# Patient Record
Sex: Female | Born: 1965 | Race: Black or African American | Hispanic: No | Marital: Married | Smoking: Never smoker
Health system: Southern US, Community
[De-identification: ages and names within clinical notes are randomized; demographics above are authoritative.]

## PROBLEM LIST (undated history)

## (undated) DIAGNOSIS — N939 Abnormal uterine and vaginal bleeding, unspecified: Secondary | ICD-10-CM

## (undated) DIAGNOSIS — D649 Anemia, unspecified: Secondary | ICD-10-CM

## (undated) DIAGNOSIS — N920 Excessive and frequent menstruation with regular cycle: Secondary | ICD-10-CM

## (undated) DIAGNOSIS — D219 Benign neoplasm of connective and other soft tissue, unspecified: Secondary | ICD-10-CM

## (undated) DIAGNOSIS — F419 Anxiety disorder, unspecified: Secondary | ICD-10-CM

## (undated) DIAGNOSIS — I240 Acute coronary thrombosis not resulting in myocardial infarction: Secondary | ICD-10-CM

## (undated) HISTORY — PX: TUBAL LIGATION: SHX77

## (undated) HISTORY — PX: WISDOM TOOTH EXTRACTION: SHX21

## (undated) HISTORY — DX: Excessive and frequent menstruation with regular cycle: N92.0

## (undated) HISTORY — PX: ABDOMINAL HYSTERECTOMY: SHX81

---

## 2008-02-20 ENCOUNTER — Emergency Department (HOSPITAL_COMMUNITY): Admission: EM | Admit: 2008-02-20 | Discharge: 2008-02-20 | Payer: Self-pay | Admitting: Emergency Medicine

## 2009-09-19 ENCOUNTER — Emergency Department (HOSPITAL_COMMUNITY): Admission: EM | Admit: 2009-09-19 | Discharge: 2009-09-19 | Payer: Self-pay | Admitting: Emergency Medicine

## 2010-12-02 LAB — URINALYSIS, ROUTINE W REFLEX MICROSCOPIC
Leukocytes, UA: NEGATIVE
Protein, ur: NEGATIVE mg/dL
Specific Gravity, Urine: 1.024 (ref 1.005–1.030)
Urobilinogen, UA: 1 mg/dL (ref 0.0–1.0)

## 2010-12-02 LAB — URINE MICROSCOPIC-ADD ON

## 2011-06-13 LAB — DIFFERENTIAL
Eosinophils Absolute: 0.1
Eosinophils Relative: 1
Lymphs Abs: 2
Monocytes Absolute: 0.8
Monocytes Relative: 10
Neutrophils Relative %: 61

## 2011-06-13 LAB — POCT I-STAT, CHEM 8
Calcium, Ion: 1.16
Creatinine, Ser: 0.8
Glucose, Bld: 96
Hemoglobin: 12.2
Potassium: 3.7

## 2011-06-13 LAB — CBC
HCT: 33.5 — ABNORMAL LOW
Hemoglobin: 11.4 — ABNORMAL LOW
MCV: 93.7
RBC: 3.58 — ABNORMAL LOW
WBC: 7.6

## 2015-08-15 ENCOUNTER — Encounter (HOSPITAL_COMMUNITY): Payer: Self-pay | Admitting: *Deleted

## 2015-08-15 ENCOUNTER — Inpatient Hospital Stay (HOSPITAL_COMMUNITY): Payer: Self-pay

## 2015-08-15 ENCOUNTER — Inpatient Hospital Stay (HOSPITAL_COMMUNITY)
Admission: AD | Admit: 2015-08-15 | Discharge: 2015-08-15 | Disposition: A | Discharge status: Home / Self Care | Payer: Self-pay | Source: Ambulatory Visit | Attending: Obstetrics & Gynecology | Admitting: Obstetrics & Gynecology

## 2015-08-15 DIAGNOSIS — R1903 Right lower quadrant abdominal swelling, mass and lump: Secondary | ICD-10-CM

## 2015-08-15 DIAGNOSIS — R1030 Lower abdominal pain, unspecified: Secondary | ICD-10-CM | POA: Insufficient documentation

## 2015-08-15 DIAGNOSIS — D500 Iron deficiency anemia secondary to blood loss (chronic): Secondary | ICD-10-CM | POA: Insufficient documentation

## 2015-08-15 DIAGNOSIS — R102 Pelvic and perineal pain: Secondary | ICD-10-CM | POA: Insufficient documentation

## 2015-08-15 DIAGNOSIS — N390 Urinary tract infection, site not specified: Secondary | ICD-10-CM | POA: Insufficient documentation

## 2015-08-15 DIAGNOSIS — D250 Submucous leiomyoma of uterus: Secondary | ICD-10-CM | POA: Insufficient documentation

## 2015-08-15 DIAGNOSIS — N939 Abnormal uterine and vaginal bleeding, unspecified: Secondary | ICD-10-CM | POA: Insufficient documentation

## 2015-08-15 HISTORY — DX: Abnormal uterine and vaginal bleeding, unspecified: N93.9

## 2015-08-15 HISTORY — DX: Benign neoplasm of connective and other soft tissue, unspecified: D21.9

## 2015-08-15 HISTORY — DX: Acute coronary thrombosis not resulting in myocardial infarction: I24.0

## 2015-08-15 HISTORY — DX: Anxiety disorder, unspecified: F41.9

## 2015-08-15 LAB — WET PREP, GENITAL
CLUE CELLS WET PREP: NONE SEEN
SPERM: NONE SEEN
TRICH WET PREP: NONE SEEN
YEAST WET PREP: NONE SEEN

## 2015-08-15 LAB — CBC
HCT: 26.4 % — ABNORMAL LOW (ref 36.0–46.0)
HEMOGLOBIN: 8 g/dL — AB (ref 12.0–15.0)
MCH: 19.9 pg — AB (ref 26.0–34.0)
MCHC: 30.3 g/dL (ref 30.0–36.0)
MCV: 65.5 fL — AB (ref 78.0–100.0)
Platelets: 368 10*3/uL (ref 150–400)
RBC: 4.03 MIL/uL (ref 3.87–5.11)
RDW: 18 % — ABNORMAL HIGH (ref 11.5–15.5)
WBC: 6.1 10*3/uL (ref 4.0–10.5)

## 2015-08-15 LAB — URINE MICROSCOPIC-ADD ON

## 2015-08-15 LAB — ABO/RH: ABO/RH(D): A POS

## 2015-08-15 LAB — URINALYSIS, ROUTINE W REFLEX MICROSCOPIC
Bilirubin Urine: NEGATIVE
GLUCOSE, UA: NEGATIVE mg/dL
Ketones, ur: NEGATIVE mg/dL
LEUKOCYTES UA: NEGATIVE
Nitrite: NEGATIVE
PH: 6 (ref 5.0–8.0)
Protein, ur: NEGATIVE mg/dL
Specific Gravity, Urine: 1.025 (ref 1.005–1.030)

## 2015-08-15 LAB — HIV ANTIBODY (ROUTINE TESTING W REFLEX): HIV Screen 4th Generation wRfx: NONREACTIVE

## 2015-08-15 LAB — POCT PREGNANCY, URINE: Preg Test, Ur: NEGATIVE

## 2015-08-15 MED ORDER — MEDROXYPROGESTERONE ACETATE 10 MG PO TABS: 10.0000 mg | ORAL_TABLET | Freq: Every day | ORAL | Status: DC

## 2015-08-15 MED ORDER — SULFAMETHOXAZOLE-TRIMETHOPRIM 800-160 MG PO TABS: 1.0000 | ORAL_TABLET | Freq: Two times a day (BID) | ORAL | Status: DC

## 2015-08-15 MED ORDER — IBUPROFEN 600 MG PO TABS: 600.0000 mg | ORAL_TABLET | Freq: Four times a day (QID) | ORAL | Status: DC | PRN

## 2015-08-15 NOTE — MAU Note (Addendum)
Abdominal and back pain X 5-6 days. States she has "very bad menstrual" for about a year. Has a hx of fibroids. Has not tried any pain medications, no tylenol or ibuprofen.

## 2015-08-15 NOTE — Discharge Instructions (Signed)
Uterine Fibroids Uterine fibroids are tissue masses (tumors) that can develop in the womb (uterus). They are also called leiomyomas. This type of tumor is not cancerous (benign) and does not spread to other parts of the body outside of the pelvic area, which is between the hip bones. Occasionally, fibroids may develop in the fallopian tubes, in the cervix, or on the support structures (ligaments) that surround the uterus. You can have one or many fibroids. Fibroids can vary in size, weight, and where they grow in the uterus. Some can become quite large. Most fibroids do not require medical treatment. CAUSES A fibroid can develop when a single uterine cell keeps growing (replicating). Most cells in the human body have a control mechanism that keeps them from replicating without control. SIGNS AND SYMPTOMS Symptoms may include:   Heavy bleeding during your period.  Bleeding or spotting between periods.  Pelvic pain and pressure.  Bladder problems, such as needing to urinate more often (urinary frequency) or urgently.  Inability to reproduce offspring (infertility).  Miscarriages. DIAGNOSIS Uterine fibroids are diagnosed through a physical exam. Your health care provider may feel the lumpy tumors during a pelvic exam. Ultrasonography and an MRI may be done to determine the size, location, and number of fibroids. TREATMENT Treatment may include:  Watchful waiting. This involves getting the fibroid checked by your health care provider to see if it grows or shrinks. Follow your health care provider's recommendations for how often to have this checked.  Hormone medicines. These can be taken by mouth or given through an intrauterine device (IUD).  Surgery.  Removing the fibroids (myomectomy) or the uterus (hysterectomy).  Removing blood supply to the fibroids (uterine artery embolization). If fibroids interfere with your fertility and you want to become pregnant, your health care provider  may recommend having the fibroids removed.  HOME CARE INSTRUCTIONS  Keep all follow-up visits as directed by your health care provider. This is important.  Take medicines only as directed by your health care provider.  If you were prescribed a hormone treatment, take the hormone medicines exactly as directed.  Do not take aspirin, because it can cause bleeding.  Ask your health care provider about taking iron pills and increasing the amount of dark green, leafy vegetables in your diet. These actions can help to boost your blood iron levels, which may be affected by heavy menstrual bleeding.  Pay close attention to your period and tell your health care provider about any changes, such as:  Increased blood flow that requires you to use more pads or tampons than usual per month.  A change in the number of days that your period lasts per month.  A change in symptoms that are associated with your period, such as abdominal cramping or back pain. SEEK MEDICAL CARE IF:  You have pelvic pain, back pain, or abdominal cramps that cannot be controlled with medicines.  You have an increase in bleeding between and during periods.  You soak tampons or pads in a half hour or less.  You feel lightheaded, extra tired, or weak. SEEK IMMEDIATE MEDICAL CARE IF:  You faint.  You have a sudden increase in pelvic pain.   This information is not intended to replace advice given to you by your health care provider. Make sure you discuss any questions you have with your health care provider.   Document Released: 08/30/2000 Document Revised: 09/23/2014 Document Reviewed: 03/01/2014 Elsevier Interactive Patient Education 2016 Elsevier Inc.   Urinary Tract Infection Urinary tract  infections (UTIs) can develop anywhere along your urinary tract. Your urinary tract is your body's drainage system for removing wastes and extra water. Your urinary tract includes two kidneys, two ureters, a bladder, and a  urethra. Your kidneys are a pair of bean-shaped organs. Each kidney is about the size of your fist. They are located below your ribs, one on each side of your spine. CAUSES Infections are caused by microbes, which are microscopic organisms, including fungi, viruses, and bacteria. These organisms are so small that they can only be seen through a microscope. Bacteria are the microbes that most commonly cause UTIs. SYMPTOMS  Symptoms of UTIs may vary by age and gender of the patient and by the location of the infection. Symptoms in young women typically include a frequent and intense urge to urinate and a painful, burning feeling in the bladder or urethra during urination. Older women and men are more likely to be tired, shaky, and weak and have muscle aches and abdominal pain. A fever may mean the infection is in your kidneys. Other symptoms of a kidney infection include pain in your back or sides below the ribs, nausea, and vomiting. DIAGNOSIS To diagnose a UTI, your caregiver will ask you about your symptoms. Your caregiver will also ask you to provide a urine sample. The urine sample will be tested for bacteria and Dun blood cells. Pavlich blood cells are made by your body to help fight infection. TREATMENT  Typically, UTIs can be treated with medication. Because most UTIs are caused by a bacterial infection, they usually can be treated with the use of antibiotics. The choice of antibiotic and length of treatment depend on your symptoms and the type of bacteria causing your infection. HOME CARE INSTRUCTIONS  If you were prescribed antibiotics, take them exactly as your caregiver instructs you. Finish the medication even if you feel better after you have only taken some of the medication.  Drink enough water and fluids to keep your urine clear or pale yellow.  Avoid caffeine, tea, and carbonated beverages. They tend to irritate your bladder.  Empty your bladder often. Avoid holding urine for long  periods of time.  Empty your bladder before and after sexual intercourse.  After a bowel movement, women should cleanse from front to back. Use each tissue only once. SEEK MEDICAL CARE IF:   You have back pain.  You develop a fever.  Your symptoms do not begin to resolve within 3 days. SEEK IMMEDIATE MEDICAL CARE IF:   You have severe back pain or lower abdominal pain.  You develop chills.  You have nausea or vomiting.  You have continued burning or discomfort with urination. MAKE SURE YOU:   Understand these instructions.  Will watch your condition.  Will get help right away if you are not doing well or get worse.   This information is not intended to replace advice given to you by your health care provider. Make sure you discuss any questions you have with your health care provider.   Document Released: 06/12/2005 Document Revised: 05/24/2015 Document Reviewed: 10/11/2011 Elsevier Interactive Patient Education 2016 Elsevier Inc.   Abnormal Uterine Bleeding Abnormal uterine bleeding can affect women at various stages in life, including teenagers, women in their reproductive years, pregnant women, and women who have reached menopause. Several kinds of uterine bleeding are considered abnormal, including:  Bleeding or spotting between periods.   Bleeding after sexual intercourse.   Bleeding that is heavier or more than normal.   Periods  that last longer than usual.  Bleeding after menopause.  Many cases of abnormal uterine bleeding are minor and simple to treat, while others are more serious. Any type of abnormal bleeding should be evaluated by your health care provider. Treatment will depend on the cause of the bleeding. HOME CARE INSTRUCTIONS Monitor your condition for any changes. The following actions may help to alleviate any discomfort you are experiencing:  Avoid the use of tampons and douches as directed by your health care provider.  Change your pads  frequently. You should get regular pelvic exams and Pap tests. Keep all follow-up appointments for diagnostic tests as directed by your health care provider.  SEEK MEDICAL CARE IF:   Your bleeding lasts more than 1 week.   You feel dizzy at times.  SEEK IMMEDIATE MEDICAL CARE IF:   You pass out.   You are changing pads every 15 to 30 minutes.   You have abdominal pain.  You have a fever.   You become sweaty or weak.   You are passing large blood clots from the vagina.   You start to feel nauseous and vomit. MAKE SURE YOU:   Understand these instructions.  Will watch your condition.  Will get help right away if you are not doing well or get worse.   This information is not intended to replace advice given to you by your health care provider. Make sure you discuss any questions you have with your health care provider.   Document Released: 09/02/2005 Document Revised: 09/07/2013 Document Reviewed: 04/01/2013 Elsevier Interactive Patient Education [employer].

## 2015-08-15 NOTE — MAU Provider Note (Signed)
Chief Complaint: Abdominal Pain and Back Pain   First Provider Initiated Contact with Patient 08/15/15 0919     SUBJECTIVE HPI: Kimberly Snow is a 49 y.o. [id number] female who presents [facility] reporting: 1. Low abdominal pain in mid low back pain since 08/10/2015. 2. His been feeling weak and occasionally dizzy x ~1 week.  3. Having 2 menstrual periods this month, passing clots and having prolonged and heavy periods x 1 year. Has not been evaluated for this. 4. Low abd mass that she first noticed in the past month. Told at [facility] that is might be fibroids. No imaging. Pt concerned that she may have uterine cancer.    Location: suprapubic Quality: cramping Severity: 9/10 on pain scale Duration: 5 days Timing: constant Modifying factors: Better w/ leaning forward Associated signs and symptoms: Neg for fever, chills, vaginal bleeding, vaginal discharge, dyspareunia, syncope, flank pain. Pos for urgency, frequency, weakness, low back pain, menstrual cycles changes, abd mass.   Past Medical History  Diagnosis Date  . Fibroids   . Headache     migraines  . Anxiety   . Abnormal vaginal bleeding   . Blockage of coronary artery of heart (HCC)     was supposed to have a stent placed, but left hospital. Normal F/U w/ cardiology since then   OB History  Gravida Para Term Preterm AB SAB TAB Ectopic Multiple Living  2 1 1  1  1   1     # Outcome Date GA Lbr Len/2nd Weight Sex Delivery Anes PTL Lv  2 TAB           1 Term              Past Surgical History  Procedure Laterality Date  . Wisdom tooth extraction     Social History   Social History  . Marital Status: Legally Separated    Spouse Name: N/A  . Number of Children: N/A  . Years of Education: N/A   Occupational History  . Not on file.   Social History Main Topics  . Smoking status: Never Smoker   . Smokeless tobacco: Never Used  . Alcohol Use: No  . Drug Use: No  . Sexual Activity: Not on file    Other Topics Concern  . Not on file   Social History Narrative   No current facility-administered medications on file prior to encounter.   No current outpatient prescriptions on file prior to encounter.   No Known Allergies  I have reviewed the past Medical Hx, Surgical Hx, Social Hx, Allergies and Medications.   Review of Systems  Constitutional: Positive for fatigue. Negative for fever, chills and appetite change.  Gastrointestinal: Positive for abdominal pain. Negative for nausea, vomiting, diarrhea, constipation, blood in stool and abdominal distention.  Genitourinary: Positive for urgency, frequency and menstrual problem. Negative for dysuria, hematuria, flank pain, vaginal bleeding, vaginal discharge, difficulty urinating, vaginal pain and pelvic pain.  Musculoskeletal: Positive for back pain. Negative for myalgias.  Neurological: Positive for dizziness and weakness. Negative for syncope and headaches.  Hematological: Does not bruise/bleed easily.    OBJECTIVE Patient Vitals for the past 24 hrs:  BP Temp Temp src Pulse Resp Height Weight  08/15/15 0838 147/90 mmHg 98 F (36.7 C) Oral 95 18 - -  08/15/15 0828 - - - - - 5' 9.5" (1.765 m) 219 lb (99.338 kg)   Constitutional: Well-developed, well-nourished female in no acute distress.  Cardiovascular: normal rate Respiratory: normal rate and  effort.  GI: Abd soft, mild SP tenderness, Firm suprapubic mass palpated up to 3?SP. Pos BS x 4 MS: Low back NT. Normal ROM Neurologic: Alert and oriented x 4.  GU: Neg CVAT.  SPECULUM EXAM: NEFG, physiologic discharge, no blood noted, cervix clean  BIMANUAL: cervix closed; uterus 14-week size, no adnexal tenderness or masses. No CMT. Bladder tender.   LAB RESULTS Results for orders placed or performed during the hospital encounter of 08/15/15 (from the past 24 hour(s))  Urinalysis, Routine w reflex microscopic (not at [facility])     Status: Abnormal   Collection Time: 08/15/15  8:30  AM  Result Value Ref Range   Color, Urine YELLOW YELLOW   APPearance CLOUDY (A) CLEAR   Specific Gravity, Urine 1.025 1.005 - 1.030   pH 6.0 5.0 - 8.0   Glucose, UA NEGATIVE NEGATIVE mg/dL   Hgb urine dipstick MODERATE (A) NEGATIVE   Bilirubin Urine NEGATIVE NEGATIVE   Ketones, ur NEGATIVE NEGATIVE mg/dL   Protein, ur NEGATIVE NEGATIVE mg/dL   Nitrite NEGATIVE NEGATIVE   Leukocytes, UA NEGATIVE NEGATIVE  Urine microscopic-add on     Status: Abnormal   Collection Time: 08/15/15  8:30 AM  Result Value Ref Range   Squamous Epithelial / LPF 0-5 (A) NONE SEEN   WBC, UA 0-5 0 - 5 WBC/hpf   RBC / HPF 0-5 0 - 5 RBC/hpf   Bacteria, UA RARE (A) NONE SEEN  Pregnancy, urine POC     Status: None   Collection Time: 08/15/15  8:33 AM  Result Value Ref Range   Preg Test, Ur NEGATIVE NEGATIVE  CBC     Status: Abnormal   Collection Time: 08/15/15  8:47 AM  Result Value Ref Range   WBC 6.1 4.0 - 10.5 K/uL   RBC 4.03 3.87 - 5.11 MIL/uL   Hemoglobin 8.0 (L) 12.0 - 15.0 g/dL   HCT 26.4 (L) 36.0 - 46.0 %   MCV 65.5 (L) 78.0 - 100.0 fL   MCH 19.9 (L) 26.0 - 34.0 pg   MCHC 30.3 30.0 - 36.0 g/dL   RDW 18.0 (H) 11.5 - 15.5 %   Platelets 368 150 - 400 K/uL  ABO/Rh     Status: None   Collection Time: 08/15/15  8:47 AM  Result Value Ref Range   ABO/RH(D) A POS   Wet prep, genital     Status: Abnormal   Collection Time: 08/15/15  9:44 AM  Result Value Ref Range   Yeast Wet Prep HPF POC NONE SEEN NONE SEEN   Trich, Wet Prep NONE SEEN NONE SEEN   Clue Cells Wet Prep HPF POC NONE SEEN NONE SEEN   WBC, Wet Prep HPF POC FEW (A) NONE SEEN   Sperm NONE SEEN     IMAGING US Transvaginal Non-ob  08/15/2015  CLINICAL DATA:  Lower abdominal and pelvic pain. Right lower quadrant mass. Abnormal uterine bleeding. Fibroids. LMP 07/30/2015. EXAM: TRANSABDOMINAL AND TRANSVAGINAL ULTRASOUND OF PELVIS TECHNIQUE: Both transabdominal and transvaginal ultrasound examinations of the pelvis were performed.  Transabdominal technique was performed for global imaging of the pelvis including uterus, ovaries, adnexal regions, and pelvic cul-de-sac. It was necessary to proceed with endovaginal exam following the transabdominal exam to visualize the endometrial stripe and ovaries. COMPARISON:  None FINDINGS: Uterus Measurements: 16.1 x 9.0 x 10.2 cm. Multiple fibroids are seen involving the uterus diffusely. At least 5 distinct fibroids are seen ranging in size from 1.7 cm to 8.0 cm. The largest 8 cm fibroid is  subserosal in location in the right uterine fundus. Several fibroids have submucosal components indenting the endometrium. Endometrium Thickness: 8 mm. Several submucosal fibroids seen indenting the endometrial stripe. Right ovary Measurements: 3.5 x 2.0 x 3.1 cm. Normal appearance/no adnexal mass. Left ovary Measurements: 3.6 x 1.8 x 2.9 cm. Normal appearance/no adnexal mass. Other findings No free fluid. IMPRESSION: Enlarged uterus with multiple fibroids, largest measuring 8 cm. Normal appearance of both ovaries.  No adnexal mass identified. Electronically Signed   By: Earle Gell M.D.   On: 08/15/2015 11:43   US Pelvis Complete  08/15/2015  CLINICAL DATA:  Lower abdominal and pelvic pain. Right lower quadrant mass. Abnormal uterine bleeding. Fibroids. LMP 07/30/2015. EXAM: TRANSABDOMINAL AND TRANSVAGINAL ULTRASOUND OF PELVIS TECHNIQUE: Both transabdominal and transvaginal ultrasound examinations of the pelvis were performed. Transabdominal technique was performed for global imaging of the pelvis including uterus, ovaries, adnexal regions, and pelvic cul-de-sac. It was necessary to proceed with endovaginal exam following the transabdominal exam to visualize the endometrial stripe and ovaries. COMPARISON:  None FINDINGS: Uterus Measurements: 16.1 x 9.0 x 10.2 cm. Multiple fibroids are seen involving the uterus diffusely. At least 5 distinct fibroids are seen ranging in size from 1.7 cm to 8.0 cm. The largest 8 cm  fibroid is subserosal in location in the right uterine fundus. Several fibroids have submucosal components indenting the endometrium. Endometrium Thickness: 8 mm. Several submucosal fibroids seen indenting the endometrial stripe. Right ovary Measurements: 3.5 x 2.0 x 3.1 cm. Normal appearance/no adnexal mass. Left ovary Measurements: 3.6 x 1.8 x 2.9 cm. Normal appearance/no adnexal mass. Other findings No free fluid. IMPRESSION: Enlarged uterus with multiple fibroids, largest measuring 8 cm. Normal appearance of both ovaries.  No adnexal mass identified. Electronically Signed   By: Earle Gell M.D.   On: 08/15/2015 11:43    MAU COURSE CBC, UA, UPT, [place]  Discussed Hx, Exam, labs w/ Dr. Harolyn Rutherford. Agrees w/ POC. Will D/C w/ Provera to use is case of heavy, prolonged bleeding  MDM -49 year-old female w/ acute low abd pain likely due to UTI. No evidence of Pyelo, PID. 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 No active bleeding. Mildly symptomatic, but hemodynamically stable.    ASSESSMENT 1. Submucous uterine fibroid   2. Lower abdominal pain   3. Abdominal mass, right lower quadrant   4. Abnormal uterine bleeding (AUB)   5. UTI (lower urinary tract infection)   6. Anemia due to chronic blood loss    PLAN Discharge home in stable condition. Bleeding Precautions Rx Provera, Bactrim, Ibuprofen.  OTC Iron. Increase dietary iron.      Follow-up Information    Follow up with [facility].   Specialty:  Obstetrics and Gynecology   Why:  Will call you to schedule follow-up appointment for endometrial biopsy   Contact information:   [place] [phone]      Follow up with [facility] [place].   Why:  For gynecologic emergencies   Contact information:   [address] [id number] [place] [phone]       Medication List    TAKE these medications        Biotin 5000 MCG Tabs  Take 1 tablet by mouth daily.     ibuprofen 600 MG tablet  Commonly known as:  ADVIL,MOTRIN  Take 1 tablet (600 mg total) by mouth every 6 (six) hours as needed for cramping.     medroxyPROGESTERone 10 MG tablet  Commonly known as:  PROVERA  Take 1 tablet (10 mg total) by mouth daily. Use for ten days if you have a prolonged or heavy period.     multivitamin with minerals Tabs tablet  Take 1 tablet by mouth daily.     sulfamethoxazole-trimethoprim 800-160 MG tablet  Commonly known as:  BACTRIM DS,SEPTRA DS  Take 1 tablet by mouth 2 (two) times daily.       [place], CNM 08/15/2015  12:19 PM

## 2015-08-16 LAB — GC/CHLAMYDIA PROBE AMP (~~LOC~~) NOT AT ARMC
Chlamydia: NEGATIVE
NEISSERIA GONORRHEA: NEGATIVE

## 2015-09-08 ENCOUNTER — Ambulatory Visit (INDEPENDENT_AMBULATORY_CARE_PROVIDER_SITE_OTHER): Payer: Self-pay | Admitting: Obstetrics & Gynecology

## 2015-09-08 ENCOUNTER — Encounter: Payer: Self-pay | Admitting: Obstetrics & Gynecology

## 2015-09-08 VITALS — BP 149/66 | HR 81 | Temp 98.5°F | Ht 66.0 in | Wt 221.4 lb

## 2015-09-08 DIAGNOSIS — D500 Iron deficiency anemia secondary to blood loss (chronic): Secondary | ICD-10-CM

## 2015-09-08 DIAGNOSIS — D649 Anemia, unspecified: Secondary | ICD-10-CM

## 2015-09-08 DIAGNOSIS — D259 Leiomyoma of uterus, unspecified: Secondary | ICD-10-CM | POA: Insufficient documentation

## 2015-09-08 DIAGNOSIS — Z3202 Encounter for pregnancy test, result negative: Secondary | ICD-10-CM

## 2015-09-08 DIAGNOSIS — Z1231 Encounter for screening mammogram for malignant neoplasm of breast: Secondary | ICD-10-CM

## 2015-09-08 HISTORY — DX: Leiomyoma of uterus, unspecified: D25.9

## 2015-09-08 HISTORY — DX: Anemia, unspecified: D64.9

## 2015-09-08 LAB — POCT PREGNANCY, URINE: PREG TEST UR: NEGATIVE

## 2015-09-08 MED ORDER — MEDROXYPROGESTERONE ACETATE 10 MG PO TABS: 20.0000 mg | ORAL_TABLET | Freq: Every day | ORAL | Status: DC

## 2015-09-08 NOTE — Patient Instructions (Signed)
Uterine Fibroids Uterine fibroids are tissue masses (tumors) that can develop in the womb (uterus). They are also called leiomyomas. This type of tumor is not cancerous (benign) and does not spread to other parts of the body outside of the pelvic area, which is between the hip bones. Occasionally, fibroids may develop in the fallopian tubes, in the cervix, or on the support structures (ligaments) that surround the uterus. You can have one or many fibroids. Fibroids can vary in size, weight, and where they grow in the uterus. Some can become quite large. Most fibroids do not require medical treatment. CAUSES A fibroid can develop when a single uterine cell keeps growing (replicating). Most cells in the human body have a control mechanism that keeps them from replicating without control. SIGNS AND SYMPTOMS Symptoms may include:   Heavy bleeding during your period.  Bleeding or spotting between periods.  Pelvic pain and pressure.  Bladder problems, such as needing to urinate more often (urinary frequency) or urgently.  Inability to reproduce offspring (infertility).  Miscarriages. DIAGNOSIS Uterine fibroids are diagnosed through a physical exam. Your health care provider may feel the lumpy tumors during a pelvic exam. Ultrasonography and an MRI may be done to determine the size, location, and number of fibroids. TREATMENT Treatment may include:  Watchful waiting. This involves getting the fibroid checked by your health care provider to see if it grows or shrinks. Follow your health care provider's recommendations for how often to have this checked.  Hormone medicines. These can be taken by mouth or given through an intrauterine device (IUD).  Surgery.  Removing the fibroids (myomectomy) or the uterus (hysterectomy).  Removing blood supply to the fibroids (uterine artery embolization). If fibroids interfere with your fertility and you want to become pregnant, your health care provider  may recommend having the fibroids removed.  HOME CARE INSTRUCTIONS  Keep all follow-up visits as directed by your health care provider. This is important.  Take medicines only as directed by your health care provider.  If you were prescribed a hormone treatment, take the hormone medicines exactly as directed.  Do not take aspirin, because it can cause bleeding.  Ask your health care provider about taking iron pills and increasing the amount of dark green, leafy vegetables in your diet. These actions can help to boost your blood iron levels, which may be affected by heavy menstrual bleeding.  Pay close attention to your period and tell your health care provider about any changes, such as:  Increased blood flow that requires you to use more pads or tampons than usual per month.  A change in the number of days that your period lasts per month.  A change in symptoms that are associated with your period, such as abdominal cramping or back pain. SEEK MEDICAL CARE IF:  You have pelvic pain, back pain, or abdominal cramps that cannot be controlled with medicines.  You have an increase in bleeding between and during periods.  You soak tampons or pads in a half hour or less.  You feel lightheaded, extra tired, or weak. SEEK IMMEDIATE MEDICAL CARE IF:  You faint.  You have a sudden increase in pelvic pain.   This information is not intended to replace advice given to you by your health care provider. Make sure you discuss any questions you have with your health care provider.   Document Released: 08/30/2000 Document Revised: 09/23/2014 Document Reviewed: 03/01/2014 Elsevier Interactive Patient Education 2016 Elsevier Inc.  

## 2015-09-08 NOTE — Progress Notes (Signed)
[facility] @ 1:00pm.  Pt notified.

## 2015-09-08 NOTE — Progress Notes (Signed)
Patient ID: Kimberly Snow, female   DOB: 1966-09-03, 49 y.o.   MRN: [id number]  Chief Complaint  Patient presents with  . Procedure    Endo Bx  f/u from [facility], abnl bleeding  HPI Kimberly Snow is a 49 y.o. female.  [id number] Patient's last menstrual period was 07/30/2015.   HPI  Past Medical History  Diagnosis Date  . Fibroids   . Headache     migraines  . Anxiety   . Abnormal vaginal bleeding   . Blockage of coronary artery of heart (HCC)     was supposed to have a stent placed, but left hospital    Past Surgical History  Procedure Laterality Date  . Wisdom tooth extraction      No family history on file.  Social History Social History  Substance Use Topics  . Smoking status: Never Smoker   . Smokeless tobacco: Never Used  . Alcohol Use: No    No Known Allergies  Current Outpatient Prescriptions  Medication Sig Dispense Refill  . Biotin 5000 MCG TABS Take 1 tablet by mouth daily.    Marland Kitchen ibuprofen (ADVIL,MOTRIN) 600 MG tablet Take 1 tablet (600 mg total) by mouth every 6 (six) hours as needed for cramping. 30 tablet 1  . Multiple Vitamin (MULTIVITAMIN WITH MINERALS) TABS tablet Take 1 tablet by mouth daily.    Marland Kitchen sulfamethoxazole-trimethoprim (BACTRIM DS,SEPTRA DS) 800-160 MG tablet Take 1 tablet by mouth 2 (two) times daily. 6 tablet 0  . medroxyPROGESTERone (PROVERA) 10 MG tablet Take 2 tablets (20 mg total) by mouth daily. Use for ten days if you have a prolonged or heavy period. 60 tablet 2   No current facility-administered medications for this visit.    Review of Systems Review of Systems  Blood pressure 149/66, pulse 81, temperature 98.5 F (36.9 C), temperature source Oral, height 5\' 6"  (1.676 m), weight 221 lb 6.4 oz (100.426 kg), last menstrual period 07/30/2015.  Physical Exam Physical Exam  Data Reviewed  CLINICAL DATA: Lower abdominal and pelvic pain. Right lower quadrant mass. Abnormal uterine bleeding. Fibroids. LMP  07/30/2015.  EXAM: TRANSABDOMINAL AND TRANSVAGINAL ULTRASOUND OF PELVIS  TECHNIQUE: Both transabdominal and transvaginal ultrasound examinations of the pelvis were performed. Transabdominal technique was performed for global imaging of the pelvis including uterus, ovaries, adnexal regions, and pelvic cul-de-sac. It was necessary to proceed with endovaginal exam following the transabdominal exam to visualize the endometrial stripe and ovaries.  COMPARISON: None  FINDINGS: Uterus  Measurements: 16.1 x 9.0 x 10.2 cm. Multiple fibroids are seen involving the uterus diffusely. At least 5 distinct fibroids are seen ranging in size from 1.7 cm to 8.0 cm. The largest 8 cm fibroid is subserosal in location in the right uterine fundus. Several fibroids have submucosal components indenting the endometrium.  Endometrium  Thickness: 8 mm. Several submucosal fibroids seen indenting the endometrial stripe.  Right ovary  Measurements: 3.5 x 2.0 x 3.1 cm. Normal appearance/no adnexal mass.  Left ovary  Measurements: 3.6 x 1.8 x 2.9 cm. Normal appearance/no adnexal mass.  Other findings  No free fluid.  IMPRESSION: Enlarged uterus with multiple fibroids, largest measuring 8 cm.  Normal appearance of both ovaries. No adnexal mass identified.   Electronically Signed  By: Earle Gell M.D.  On: 08/15/2015 11:43  CBC    Component Value Date/Time   WBC 6.1 08/15/2015 0847   RBC 4.03 08/15/2015 0847   HGB 8.0* 08/15/2015 0847   HCT 26.4* 08/15/2015 0847   PLT 368 08/15/2015 0847  MCV 65.5* 08/15/2015 0847   MCH 19.9* 08/15/2015 0847   MCHC 30.3 08/15/2015 0847   RDW 18.0* 08/15/2015 0847   LYMPHSABS 2.0 02/20/2008 1758   MONOABS 0.8 02/20/2008 1758   EOSABS 0.1 02/20/2008 1758   BASOSABS 0.0 02/20/2008 1758     Assessment    Fibroid uterus and DUB Anemia     Plan    Current Outpatient Prescriptions on File Prior to Visit  Medication Sig  Dispense Refill  . Biotin 5000 MCG TABS Take 1 tablet by mouth daily.    Marland Kitchen ibuprofen (ADVIL,MOTRIN) 600 MG tablet Take 1 tablet (600 mg total) by mouth every 6 (six) hours as needed for cramping. 30 tablet 1  . Multiple Vitamin (MULTIVITAMIN WITH MINERALS) TABS tablet Take 1 tablet by mouth daily.    Marland Kitchen sulfamethoxazole-trimethoprim (BACTRIM DS,SEPTRA DS) 800-160 MG tablet Take 1 tablet by mouth 2 (two) times daily. 6 tablet 0   No current facility-administered medications on file prior to visit.   Provera 20 mg po daily RTC for endometrial biopsy Needs pap result from HD        ARNOLD,JAMES 09/08/2015, 9:47 AM

## 2015-09-25 ENCOUNTER — Ambulatory Visit: Payer: Self-pay

## 2015-10-09 ENCOUNTER — Other Ambulatory Visit: Payer: Self-pay | Admitting: Obstetrics & Gynecology

## 2015-11-09 ENCOUNTER — Other Ambulatory Visit: Payer: Self-pay | Admitting: Obstetrics & Gynecology

## 2016-03-13 ENCOUNTER — Other Ambulatory Visit: Payer: Self-pay | Admitting: Obstetrics and Gynecology

## 2016-04-03 ENCOUNTER — Other Ambulatory Visit: Payer: Self-pay | Admitting: Obstetrics and Gynecology

## 2016-04-04 NOTE — Patient Instructions (Signed)
Your procedure is scheduled on:  Thursday, April 25, 2016  Enter through the Main Entrance of [facility] at:  6:00 AM   Pick up the phone at the desk and dial [phone].  Call this number if you have problems the morning of surgery: [phone].  Remember: Do NOT eat food or drink after:  Midnight Wednesday, April 24, 2016  Take these medicines the morning of surgery with a SIP OF WATER:  None  Do NOT wear jewelry (body piercing), metal hair clips/bobby pins, make-up, or nail polish. Do NOT wear lotions, powders, or perfumes.  You may wear deodorant. Do NOT shave for 48 hours prior to surgery. Do NOT bring valuables to the hospital. Contacts, dentures, or bridgework may not be worn into surgery.  Leave suitcase in car.  After surgery it may be brought to your room.  For patients admitted to the hospital, checkout time is 11:00 AM the day of discharge.

## 2016-04-05 ENCOUNTER — Inpatient Hospital Stay (HOSPITAL_COMMUNITY)
Admission: RE | Admit: 2016-04-05 | Discharge: 2016-04-05 | Disposition: A | Discharge status: Home / Self Care | Payer: Self-pay | Source: Ambulatory Visit

## 2016-04-19 ENCOUNTER — Other Ambulatory Visit (HOSPITAL_COMMUNITY): Payer: BLUE CROSS/BLUE SHIELD

## 2016-04-25 ENCOUNTER — Encounter (HOSPITAL_COMMUNITY): Admission: RE | Payer: Self-pay | Source: Ambulatory Visit

## 2016-04-25 ENCOUNTER — Inpatient Hospital Stay (HOSPITAL_COMMUNITY)
Admission: RE | Admit: 2016-04-25 | Payer: BLUE CROSS/BLUE SHIELD | Source: Ambulatory Visit | Admitting: Obstetrics and Gynecology

## 2016-04-25 SURGERY — HYSTERECTOMY, TOTAL, ABDOMINAL, WITH SALPINGECTOMY
Anesthesia: General | Laterality: Bilateral

## 2016-05-02 ENCOUNTER — Other Ambulatory Visit: Payer: Self-pay | Admitting: Obstetrics and Gynecology

## 2016-05-03 ENCOUNTER — Other Ambulatory Visit: Payer: Self-pay | Admitting: Obstetrics and Gynecology

## 2016-05-03 MED ORDER — DEXTROSE 5 % IV SOLN: 2.0000 g | INTRAVENOUS | Status: DC

## 2016-05-05 NOTE — H&P (Signed)
Kimberly Snow is a 50 y.o. female P: 1-0-2-1 who presents for hysterectomy because of symptomatic uterine fibroids and menorrhagia.  This patient,  with a known history of uterine fibroids,  reports a significant increase in her menstrual flow over the past year.  Her  flow, with clots,  would last  for  15-19 days each month and in spite of wearing pads with tampons every 2 hours,  she would still soil her clothes. In August 2017 the patient's  hemoglobin and hematocrit returned 9.5/31.4  respectively.  A pelvic ultrasound in November 2016  showed: uterus-16.1 x 9.0 x 10.2 cm with diffuse fibroid involvement; there werer   #5 distinct fibroids ranging from 1.7-8.0 cm. The largest, a right sub-serosal measured 8 cm and several with sub-mucosal component that indented the endometrium. The right ovary measured 3.5 x 2.0 x 3.1 cm and left ovary, 3.6 x 1.8 x 2.9 cm.  The patient denies fatigue, shortness of breath, bowel changes, dysuria, incontinence,  chest pain or lightheadedness. She does admit to dyspareunia,  urinary frequency  and nocturia (every 30 minutes) that has resulted in severe insomnia.  An endometrial biopsy in June 2017 revealed benign secretory endometrium with no hyperplasia or malignancy.   A Mirena IUD was placed in August 2017 in a effort to curtail the patient's bleeding as she prepared for surgical management of her symptoms.  In the interim, however,  she experienced a significant bleeding episode along with severe cramping that required IM Ketorolac 30 mg.  To curtail  this  bleeding , she was given oral progestin, however, the patient developed a  severe anxiety reaction and so it was discontinued. A review of both medical and surgical management options were given to the patient for her menstrual disorder and fibroids.  However, given the magnitude of her menstrual flow and the  challenges involved with managing her menstrual cramps,  she  has decided to proceed with definitive therapy in  the form of hysterectomy.   Past Medical History  OB History: G: 3  P: 1-0-2-1;  SVB 1992  GYN History: menarche: 50 YO    LMP: see HPI   Contracepton: Tubal Sterilization;  Has a remote history of syphilis.  Denies history of abnormal PAP smear.  Last PAP smear: 2016-normal  Medical History: Coronary Artery Disease, Migraines, Anxiety and  Anemia  Surgical History: 1992  Tubal Sterilization Denies problems with anesthesia.  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 Medroxyprogesterone Anxiety    ROS:  Denies headache, vision changes, nasal congestion, dysphagia, tinnitus, dizziness, hoarseness, cough,  chest pain, shortness of breath, nausea, vomiting, diarrhea,constipation,  urinary frequency, urgency  dysuria, hematuria, vaginitis symptoms,  swelling of joints,easy bruising,  myalgias, arthralgias, skin rashes, unexplained weight loss and except as is mentioned in the history of present illness, patient's review of systems is otherwise negative.   Physical Exam  Bp: 128/72    Temperature: 98.4 degrees F orally  Weight: 215 lbs.  Height: 5'9" BMI: 31.7  Neck: supple without masses or thyromegaly Lungs: clear to auscultation Heart: regular rate and rhythm Abdomen: firm and -tender mass from pelvis to  3 fingers below umbilicus Pelvic:EGBUS- wnl; vagina-normal rugae; uterus-18 weeks size,  irregular and tender, cervix without lesions or motion tenderness; adnexae-no tenderness or masses Extremities:  no clubbing, cyanosis or edema   Assesment: Symptomatic Uterine Fibroids             Menorrhagia   Disposition:  A discussion was held with patient regarding the indication for her  procedure(s) along with the risks, which include but are not limited to: reaction to anesthesia, damage to adjacent organs, infection and excessive bleeding. The patient verbalized understanding of these risks and has consented to proceed with a Total Abdominal Hysterectomy at [facility] on May 13, 2016.   CSN# [id number]   [place] J. Florene Glen, PA-C  for Dr. Seymour Bars. Sara Keys

## 2016-05-06 ENCOUNTER — Encounter (HOSPITAL_COMMUNITY)
Admission: RE | Admit: 2016-05-06 | Discharge: 2016-05-06 | Disposition: A | Discharge status: Home / Self Care | Payer: BLUE CROSS/BLUE SHIELD | Source: Ambulatory Visit | Attending: Obstetrics and Gynecology | Admitting: Obstetrics and Gynecology

## 2016-05-06 ENCOUNTER — Encounter (HOSPITAL_COMMUNITY): Payer: Self-pay

## 2016-05-06 DIAGNOSIS — N920 Excessive and frequent menstruation with regular cycle: Secondary | ICD-10-CM | POA: Insufficient documentation

## 2016-05-06 DIAGNOSIS — G43909 Migraine, unspecified, not intractable, without status migrainosus: Secondary | ICD-10-CM | POA: Insufficient documentation

## 2016-05-06 DIAGNOSIS — Z975 Presence of (intrauterine) contraceptive device: Secondary | ICD-10-CM | POA: Insufficient documentation

## 2016-05-06 DIAGNOSIS — D649 Anemia, unspecified: Secondary | ICD-10-CM | POA: Diagnosis not present

## 2016-05-06 DIAGNOSIS — I2510 Atherosclerotic heart disease of native coronary artery without angina pectoris: Secondary | ICD-10-CM | POA: Insufficient documentation

## 2016-05-06 DIAGNOSIS — Z888 Allergy status to other drugs, medicaments and biological substances status: Secondary | ICD-10-CM | POA: Diagnosis not present

## 2016-05-06 DIAGNOSIS — F419 Anxiety disorder, unspecified: Secondary | ICD-10-CM | POA: Diagnosis not present

## 2016-05-06 DIAGNOSIS — Z01812 Encounter for preprocedural laboratory examination: Secondary | ICD-10-CM | POA: Diagnosis not present

## 2016-05-06 DIAGNOSIS — D259 Leiomyoma of uterus, unspecified: Secondary | ICD-10-CM | POA: Diagnosis not present

## 2016-05-06 DIAGNOSIS — Z79899 Other long term (current) drug therapy: Secondary | ICD-10-CM | POA: Diagnosis not present

## 2016-05-06 HISTORY — DX: Anemia, unspecified: D64.9

## 2016-05-06 LAB — CBC
HEMATOCRIT: 31.8 % — AB (ref 36.0–46.0)
HEMOGLOBIN: 10.3 g/dL — AB (ref 12.0–15.0)
MCH: 22.2 pg — ABNORMAL LOW (ref 26.0–34.0)
MCHC: 32.4 g/dL (ref 30.0–36.0)
MCV: 68.4 fL — ABNORMAL LOW (ref 78.0–100.0)
Platelets: 282 10*3/uL (ref 150–400)
RBC: 4.65 MIL/uL (ref 3.87–5.11)
RDW: 23.9 % — AB (ref 11.5–15.5)
WBC: 7 10*3/uL (ref 4.0–10.5)

## 2016-05-06 NOTE — Patient Instructions (Addendum)
Your procedure is scheduled on:  Monday, May 13, 2016  Enter through the Main Entrance of [facility] at:  6:00 AM  Pick up the phone at the desk and dial [phone].  Call this number if you have problems the morning of surgery: [phone].  Remember: Do NOT eat food or drink after:  Midnight Sunday  Take these medicines the morning of surgery with a SIP OF WATER:  None  Do NOT wear jewelry (body piercing), metal hair clips/bobby pins, make-up, or nail polish. Do NOT wear lotions, powders, or perfumes.  You may wear deodorant. Do NOT shave for 48 hours prior to surgery. Do NOT bring valuables to the hospital. Contacts, dentures, or bridgework may not be worn into surgery.  Leave suitcase in car.  After surgery it may be brought to your room.  For patients admitted to the hospital, checkout time is 11:00 AM the day of discharge.

## 2016-05-12 ENCOUNTER — Encounter (HOSPITAL_COMMUNITY): Payer: Self-pay | Admitting: Obstetrics and Gynecology

## 2016-05-12 ENCOUNTER — Encounter: Payer: Self-pay | Admitting: Obstetrics and Gynecology

## 2016-05-12 ENCOUNTER — Telehealth: Payer: Self-pay | Admitting: Obstetrics and Gynecology

## 2016-05-12 DIAGNOSIS — N920 Excessive and frequent menstruation with regular cycle: Secondary | ICD-10-CM

## 2016-05-12 DIAGNOSIS — N921 Excessive and frequent menstruation with irregular cycle: Secondary | ICD-10-CM

## 2016-05-12 HISTORY — DX: Excessive and frequent menstruation with regular cycle: N92.0

## 2016-05-12 MED ORDER — DEXTROSE 5 % IV SOLN
2.0000 g | INTRAVENOUS | Status: AC
  Administered 2016-05-13: 2 g via INTRAVENOUS
  Filled 2016-05-12: qty 2

## 2016-05-12 NOTE — Anesthesia Preprocedure Evaluation (Addendum)
Anesthesia Evaluation  Patient identified by MRN, date of birth, ID band Patient awake    Reviewed: Allergy & Precautions, NPO status , Patient's Chart, lab work & pertinent test results  History of Anesthesia Complications Negative for: history of anesthetic complications  Airway Mallampati: II  TM Distance: >3 FB Neck ROM: Full    Dental no notable dental hx. (+) Dental Advisory Given   Pulmonary neg pulmonary ROS,    Pulmonary exam normal        Cardiovascular + CAD  Normal cardiovascular exam     Neuro/Psych  Headaches, PSYCHIATRIC DISORDERS Anxiety Depression    GI/Hepatic negative GI ROS, Neg liver ROS,   Endo/Other    Renal/GU negative Renal ROS     Musculoskeletal   Abdominal   Peds  Hematology   Anesthesia Other Findings   Reproductive/Obstetrics                          Anesthesia Physical Anesthesia Plan  ASA: III  Anesthesia Plan: General   Post-op Pain Management:    Induction: Intravenous  Airway Management Planned: Oral ETT  Additional Equipment:   Intra-op Plan:   Post-operative Plan: Extubation in OR  Informed Consent: I have reviewed the patients History and Physical, chart, labs and discussed the procedure including the risks, benefits and alternatives for the proposed anesthesia with the patient or authorized representative who has indicated his/her understanding and acceptance.   Dental advisory given  Plan Discussed with: Anesthesiologist, Surgeon and CRNA  Anesthesia Plan Comments: (I had an long discussion with Mrs. McKinzie about her cardiac history.  She has not seen her cardiologist since 2010, but has lost weight and denies any cardiac symptoms despite an active life since her initial diagnosis.  I explained to her that I have no way of knowing how severe her CAD is and that we would recommend Cardiology evaluation prior to surgery. She understands  that her risk is unknown.  She also understands that this facility does not normally care for acute cardiac problems and if there was an issue her definitive care would be delayed.  She voiced understanding and clearly stated that she wishes to proceed with her surgery today in this location.)       Anesthesia Quick Evaluation

## 2016-05-13 ENCOUNTER — Inpatient Hospital Stay (HOSPITAL_COMMUNITY): Payer: BLUE CROSS/BLUE SHIELD | Admitting: Anesthesiology

## 2016-05-13 ENCOUNTER — Encounter (HOSPITAL_COMMUNITY)
Admission: RE | Disposition: A | Discharge status: Home / Self Care | Payer: Self-pay | Source: Ambulatory Visit | Attending: Obstetrics and Gynecology

## 2016-05-13 ENCOUNTER — Encounter (HOSPITAL_COMMUNITY): Payer: Self-pay

## 2016-05-13 ENCOUNTER — Inpatient Hospital Stay (HOSPITAL_COMMUNITY)
Admission: RE | Admit: 2016-05-13 | Discharge: 2016-05-14 | DRG: 743 | Disposition: A | Discharge status: Home / Self Care | Payer: BLUE CROSS/BLUE SHIELD | Source: Ambulatory Visit | Attending: Obstetrics and Gynecology | Admitting: Obstetrics and Gynecology

## 2016-05-13 DIAGNOSIS — N921 Excessive and frequent menstruation with irregular cycle: Secondary | ICD-10-CM

## 2016-05-13 DIAGNOSIS — D252 Subserosal leiomyoma of uterus: Secondary | ICD-10-CM | POA: Diagnosis present

## 2016-05-13 DIAGNOSIS — Z30432 Encounter for removal of intrauterine contraceptive device: Secondary | ICD-10-CM

## 2016-05-13 DIAGNOSIS — I2510 Atherosclerotic heart disease of native coronary artery without angina pectoris: Secondary | ICD-10-CM | POA: Diagnosis present

## 2016-05-13 DIAGNOSIS — F419 Anxiety disorder, unspecified: Secondary | ICD-10-CM | POA: Diagnosis present

## 2016-05-13 DIAGNOSIS — D250 Submucous leiomyoma of uterus: Secondary | ICD-10-CM | POA: Diagnosis present

## 2016-05-13 DIAGNOSIS — N941 Unspecified dyspareunia: Secondary | ICD-10-CM | POA: Diagnosis present

## 2016-05-13 DIAGNOSIS — D259 Leiomyoma of uterus, unspecified: Secondary | ICD-10-CM

## 2016-05-13 DIAGNOSIS — N920 Excessive and frequent menstruation with regular cycle: Secondary | ICD-10-CM | POA: Diagnosis present

## 2016-05-13 DIAGNOSIS — D649 Anemia, unspecified: Secondary | ICD-10-CM | POA: Diagnosis present

## 2016-05-13 DIAGNOSIS — D500 Iron deficiency anemia secondary to blood loss (chronic): Secondary | ICD-10-CM

## 2016-05-13 DIAGNOSIS — D251 Intramural leiomyoma of uterus: Secondary | ICD-10-CM | POA: Diagnosis present

## 2016-05-13 HISTORY — PX: HYSTERECTOMY ABDOMINAL WITH SALPINGECTOMY: SHX6725

## 2016-05-13 LAB — COMPREHENSIVE METABOLIC PANEL
ALBUMIN: 3.7 g/dL (ref 3.5–5.0)
ALT: 11 U/L — ABNORMAL LOW (ref 14–54)
AST: 16 U/L (ref 15–41)
Alkaline Phosphatase: 42 U/L (ref 38–126)
Anion gap: 6 (ref 5–15)
BUN: 10 mg/dL (ref 6–20)
CHLORIDE: 99 mmol/L — AB (ref 101–111)
CO2: 28 mmol/L (ref 22–32)
Calcium: 8.4 mg/dL — ABNORMAL LOW (ref 8.9–10.3)
Creatinine, Ser: 0.68 mg/dL (ref 0.44–1.00)
GFR calc Af Amer: >60 mL/min (ref >60)
Glucose, Bld: 93 mg/dL (ref 65–99)
POTASSIUM: 2.8 mmol/L — AB (ref 3.5–5.1)
Sodium: 133 mmol/L — ABNORMAL LOW (ref 135–145)
Total Bilirubin: 0.5 mg/dL (ref 0.3–1.2)
Total Protein: 7.5 g/dL (ref 6.5–8.1)

## 2016-05-13 LAB — PREGNANCY, URINE: PREG TEST UR: NEGATIVE

## 2016-05-13 LAB — PREPARE RBC (CROSSMATCH)

## 2016-05-13 SURGERY — HYSTERECTOMY, TOTAL, ABDOMINAL, WITH SALPINGECTOMY
Anesthesia: General | Site: Uterus | Laterality: Bilateral

## 2016-05-13 MED ORDER — HYDROMORPHONE HCL 1 MG/ML IJ SOLN
0.2500 mg | INTRAMUSCULAR | Status: DC | PRN
  Administered 2016-05-13 (×3): 0.5 mg via INTRAVENOUS

## 2016-05-13 MED ORDER — GLYCOPYRROLATE 0.2 MG/ML IJ SOLN
INTRAMUSCULAR | Status: DC | PRN
  Administered 2016-05-13: 0.6 mg via INTRAVENOUS

## 2016-05-13 MED ORDER — ONDANSETRON HCL 4 MG PO TABS: 4.0000 mg | ORAL_TABLET | Freq: Three times a day (TID) | ORAL | Status: DC | PRN

## 2016-05-13 MED ORDER — MENTHOL 3 MG MT LOZG: 1.0000 | LOZENGE | OROMUCOSAL | Status: DC | PRN

## 2016-05-13 MED ORDER — ONDANSETRON HCL 4 MG/2ML IJ SOLN
INTRAMUSCULAR | Status: DC | PRN
Start: 2016-05-13 — End: 2016-05-13
  Administered 2016-05-13: 4 mg via INTRAVENOUS

## 2016-05-13 MED ORDER — HYDROMORPHONE HCL 1 MG/ML IJ SOLN
INTRAMUSCULAR | Status: AC
  Filled 2016-05-13: qty 1

## 2016-05-13 MED ORDER — METOPROLOL TARTRATE 5 MG/5ML IV SOLN
5.0000 mg | INTRAVENOUS | Status: AC | PRN
  Administered 2016-05-13 (×2): 2.5 mg via INTRAVENOUS
  Filled 2016-05-13: qty 5

## 2016-05-13 MED ORDER — LACTATED RINGERS IV SOLN
INTRAVENOUS | Status: DC
  Administered 2016-05-13 (×4): via INTRAVENOUS

## 2016-05-13 MED ORDER — BUPIVACAINE HCL (PF) 0.25 % IJ SOLN
INTRAMUSCULAR | Status: AC
  Filled 2016-05-13: qty 30

## 2016-05-13 MED ORDER — LIDOCAINE HCL 1 % IJ SOLN
INTRAMUSCULAR | Status: AC
  Filled 2016-05-13: qty 20

## 2016-05-13 MED ORDER — NALOXONE HCL 0.4 MG/ML IJ SOLN: 0.4000 mg | INTRAMUSCULAR | Status: DC | PRN

## 2016-05-13 MED ORDER — FENTANYL CITRATE (PF) 100 MCG/2ML IJ SOLN
INTRAMUSCULAR | Status: DC | PRN
  Administered 2016-05-13: 50 ug via INTRAVENOUS
  Administered 2016-05-13: 100 ug via INTRAVENOUS
  Administered 2016-05-13: 50 ug via INTRAVENOUS
  Administered 2016-05-13: 150 ug via INTRAVENOUS
  Administered 2016-05-13: 50 ug via INTRAVENOUS
  Administered 2016-05-13: 100 ug via INTRAVENOUS

## 2016-05-13 MED ORDER — DEXAMETHASONE SODIUM PHOSPHATE 4 MG/ML IJ SOLN
INTRAMUSCULAR | Status: AC
  Filled 2016-05-13: qty 1

## 2016-05-13 MED ORDER — ESMOLOL HCL 100 MG/10ML IV SOLN
INTRAVENOUS | Status: DC | PRN
  Administered 2016-05-13: 50 mg via INTRAVENOUS

## 2016-05-13 MED ORDER — MIDAZOLAM HCL 2 MG/2ML IJ SOLN
INTRAMUSCULAR | Status: AC
  Filled 2016-05-13: qty 2

## 2016-05-13 MED ORDER — SCOPOLAMINE 1 MG/3DAYS TD PT72
MEDICATED_PATCH | TRANSDERMAL | Status: AC
  Administered 2016-05-13: 1.5 mg via TRANSDERMAL
  Filled 2016-05-13: qty 1

## 2016-05-13 MED ORDER — PROPOFOL 10 MG/ML IV BOLUS
INTRAVENOUS | Status: DC | PRN
  Administered 2016-05-13: 50 mg via INTRAVENOUS
  Administered 2016-05-13: 150 mg via INTRAVENOUS

## 2016-05-13 MED ORDER — IBUPROFEN 600 MG PO TABS: 600.0000 mg | ORAL_TABLET | Freq: Four times a day (QID) | ORAL | Status: DC | PRN

## 2016-05-13 MED ORDER — FERROUS SULFATE 325 (65 FE) MG PO TABS
325.0000 mg | ORAL_TABLET | Freq: Two times a day (BID) | ORAL | Status: DC
  Administered 2016-05-14: 325 mg via ORAL
  Filled 2016-05-13: qty 1

## 2016-05-13 MED ORDER — LIDOCAINE HCL (CARDIAC) 20 MG/ML IV SOLN
INTRAVENOUS | Status: AC
  Filled 2016-05-13: qty 5

## 2016-05-13 MED ORDER — MIDAZOLAM HCL 2 MG/2ML IJ SOLN
INTRAMUSCULAR | Status: DC | PRN
  Administered 2016-05-13: 2 mg via INTRAVENOUS

## 2016-05-13 MED ORDER — BUPIVACAINE HCL (PF) 0.25 % IJ SOLN
INTRAMUSCULAR | Status: DC | PRN
  Administered 2016-05-13: 20 mL

## 2016-05-13 MED ORDER — SCOPOLAMINE 1 MG/3DAYS TD PT72
1.0000 | MEDICATED_PATCH | Freq: Once | TRANSDERMAL | Status: DC
  Administered 2016-05-13: 1.5 mg via TRANSDERMAL

## 2016-05-13 MED ORDER — LIDOCAINE HCL (CARDIAC) 20 MG/ML IV SOLN
INTRAVENOUS | Status: DC | PRN
  Administered 2016-05-13: 100 mg via INTRAVENOUS

## 2016-05-13 MED ORDER — HYDROMORPHONE HCL 1 MG/ML IJ SOLN
INTRAMUSCULAR | Status: DC | PRN
  Administered 2016-05-13 (×2): 1 mg via INTRAVENOUS

## 2016-05-13 MED ORDER — ONDANSETRON HCL 4 MG/2ML IJ SOLN
4.0000 mg | Freq: Four times a day (QID) | INTRAMUSCULAR | Status: DC | PRN
  Administered 2016-05-13: 4 mg via INTRAVENOUS
  Filled 2016-05-13: qty 2

## 2016-05-13 MED ORDER — ONDANSETRON HCL 4 MG/2ML IJ SOLN
INTRAMUSCULAR | Status: AC
  Filled 2016-05-13: qty 2

## 2016-05-13 MED ORDER — KETOROLAC TROMETHAMINE 30 MG/ML IJ SOLN: 30.0000 mg | Freq: Four times a day (QID) | INTRAMUSCULAR | Status: DC

## 2016-05-13 MED ORDER — DOCUSATE SODIUM 100 MG PO CAPS
100.0000 mg | ORAL_CAPSULE | Freq: Two times a day (BID) | ORAL | Status: DC
  Administered 2016-05-13: 100 mg via ORAL
  Filled 2016-05-13: qty 1

## 2016-05-13 MED ORDER — KETOROLAC TROMETHAMINE 30 MG/ML IJ SOLN
INTRAMUSCULAR | Status: DC | PRN
Start: 2016-05-13 — End: 2016-05-13
  Administered 2016-05-13: 30 mg via INTRAVENOUS

## 2016-05-13 MED ORDER — KETOROLAC TROMETHAMINE 30 MG/ML IJ SOLN
INTRAMUSCULAR | Status: AC
  Filled 2016-05-13: qty 1

## 2016-05-13 MED ORDER — DEXAMETHASONE SODIUM PHOSPHATE 10 MG/ML IJ SOLN
INTRAMUSCULAR | Status: DC | PRN
  Administered 2016-05-13: 10 mg via INTRAVENOUS

## 2016-05-13 MED ORDER — DIPHENHYDRAMINE HCL 50 MG/ML IJ SOLN
12.5000 mg | Freq: Four times a day (QID) | INTRAMUSCULAR | Status: DC | PRN
  Administered 2016-05-13 (×2): 12.5 mg via INTRAVENOUS
  Filled 2016-05-13 (×2): qty 1

## 2016-05-13 MED ORDER — KETOROLAC TROMETHAMINE 30 MG/ML IJ SOLN
30.0000 mg | Freq: Four times a day (QID) | INTRAMUSCULAR | Status: DC
  Administered 2016-05-13 – 2016-05-14 (×3): 30 mg via INTRAVENOUS
  Filled 2016-05-13 (×3): qty 1

## 2016-05-13 MED ORDER — ROCURONIUM BROMIDE 100 MG/10ML IV SOLN
INTRAVENOUS | Status: DC | PRN
  Administered 2016-05-13 (×2): 10 mg via INTRAVENOUS
  Administered 2016-05-13: 50 mg via INTRAVENOUS
  Administered 2016-05-13 (×2): 10 mg via INTRAVENOUS

## 2016-05-13 MED ORDER — NEOSTIGMINE METHYLSULFATE 10 MG/10ML IV SOLN
INTRAVENOUS | Status: DC | PRN
  Administered 2016-05-13: 4 mg via INTRAVENOUS

## 2016-05-13 MED ORDER — OXYCODONE-ACETAMINOPHEN 5-325 MG PO TABS
1.0000 | ORAL_TABLET | ORAL | Status: DC | PRN
  Administered 2016-05-14: 1 via ORAL
  Filled 2016-05-13: qty 1

## 2016-05-13 MED ORDER — SODIUM CHLORIDE 0.9% FLUSH: 9.0000 mL | INTRAVENOUS | Status: DC | PRN

## 2016-05-13 MED ORDER — PROPOFOL 10 MG/ML IV BOLUS
INTRAVENOUS | Status: AC
  Filled 2016-05-13: qty 20

## 2016-05-13 MED ORDER — DIPHENHYDRAMINE HCL 12.5 MG/5ML PO ELIX
12.5000 mg | ORAL_SOLUTION | Freq: Four times a day (QID) | ORAL | Status: DC | PRN
  Administered 2016-05-14: 12.5 mg via ORAL
  Filled 2016-05-13: qty 5

## 2016-05-13 MED ORDER — ROCURONIUM BROMIDE 100 MG/10ML IV SOLN
INTRAVENOUS | Status: AC
  Filled 2016-05-13: qty 1

## 2016-05-13 MED ORDER — DEXAMETHASONE SODIUM PHOSPHATE 10 MG/ML IJ SOLN
INTRAMUSCULAR | Status: AC
  Filled 2016-05-13: qty 1

## 2016-05-13 MED ORDER — FENTANYL CITRATE (PF) 250 MCG/5ML IJ SOLN
INTRAMUSCULAR | Status: AC
  Filled 2016-05-13: qty 5

## 2016-05-13 MED ORDER — LACTATED RINGERS IV SOLN
INTRAVENOUS | Status: DC
Start: 2016-05-13 — End: 2016-05-14
  Administered 2016-05-13: 21:00:00 via INTRAVENOUS

## 2016-05-13 MED ORDER — HYDROMORPHONE 1 MG/ML IV SOLN
INTRAVENOUS | Status: DC
  Administered 2016-05-13: 3.9 mg via INTRAVENOUS
  Administered 2016-05-13: 14:00:00 via INTRAVENOUS
  Administered 2016-05-13 – 2016-05-14 (×2): 2.4 mg via INTRAVENOUS
  Filled 2016-05-13: qty 25

## 2016-05-13 SURGICAL SUPPLY — 51 items
BLADE SURG 10 STRL SS (BLADE) ×1 IMPLANT
CANISTER SUCT 3000ML (MISCELLANEOUS) ×2 IMPLANT
CATH ROBINSON RED A/P 16FR (CATHETERS) IMPLANT
CLOTH BEACON ORANGE TIMEOUT ST (SAFETY) ×2 IMPLANT
CONT PATH 16OZ SNAP LID 3702 (MISCELLANEOUS) ×2 IMPLANT
DECANTER SPIKE VIAL GLASS SM (MISCELLANEOUS) ×1 IMPLANT
DRAIN JACKSON PRT FLT 7MM (DRAIN) IMPLANT
DRAPE WARM FLUID 44X44 (DRAPE) IMPLANT
DRSG OPSITE POSTOP 4X10 (GAUZE/BANDAGES/DRESSINGS) ×2 IMPLANT
DURAPREP 26ML APPLICATOR (WOUND CARE) ×2 IMPLANT
ELECT CAUTERY BLADE 6.4 (BLADE) IMPLANT
ELECT NDL TIP 2.8 STRL (NEEDLE) IMPLANT
ELECT NEEDLE TIP 2.8 STRL (NEEDLE) ×2 IMPLANT
EVACUATOR SILICONE 100CC (DRAIN) IMPLANT
EXTENDER ELECT LOOP LEEP 10CM (CUTTING LOOP) ×1 IMPLANT
GAUZE SPONGE 4X4 16PLY XRAY LF (GAUZE/BANDAGES/DRESSINGS) ×1 IMPLANT
GLOVE BIOGEL PI IND STRL 7.0 (GLOVE) ×1 IMPLANT
GLOVE BIOGEL PI INDICATOR 7.0 (GLOVE) ×3
GLOVE SURG SS PI 6.5 STRL IVOR (GLOVE) ×4 IMPLANT
GOWN STRL REUS W/TWL LRG LVL3 (GOWN DISPOSABLE) ×6 IMPLANT
LIQUID BAND (GAUZE/BANDAGES/DRESSINGS) ×1 IMPLANT
NDL SPNL 22GX3.5 QUINCKE BK (NEEDLE) ×1 IMPLANT
NEEDLE HYPO 22GX1.5 SAFETY (NEEDLE) IMPLANT
NEEDLE SPNL 22GX3.5 QUINCKE BK (NEEDLE) ×2 IMPLANT
NS IRRIG 1000ML POUR BTL (IV SOLUTION) ×4 IMPLANT
PACK ABDOMINAL GYN (CUSTOM PROCEDURE TRAY) ×2 IMPLANT
PAD OB MATERNITY 4.3X12.25 (PERSONAL CARE ITEMS) ×2 IMPLANT
PENCIL SMOKE EVAC W/HOLSTER (ELECTROSURGICAL) ×2 IMPLANT
PROTECTOR NERVE ULNAR (MISCELLANEOUS) ×4 IMPLANT
SPONGE LAP 18X18 X RAY DECT (DISPOSABLE) ×4 IMPLANT
STAPLER VISISTAT 35W (STAPLE) IMPLANT
SUT MNCRL AB 3-0 PS2 27 (SUTURE) ×1 IMPLANT
SUT PDS AB 1 CT  36 (SUTURE)
SUT PDS AB 1 CT 36 (SUTURE) IMPLANT
SUT PLAIN 2 0 XLH (SUTURE) IMPLANT
SUT SILK 0 FSL (SUTURE) IMPLANT
SUT VIC AB 0 CT1 18XCR BRD8 (SUTURE) ×3 IMPLANT
SUT VIC AB 0 CT1 27 (SUTURE) ×4
SUT VIC AB 0 CT1 27XBRD ANBCTR (SUTURE) IMPLANT
SUT VIC AB 0 CT1 27XCR 8 STRN (SUTURE) ×1 IMPLANT
SUT VIC AB 0 CT1 8-18 (SUTURE) ×6
SUT VIC AB 2-0 CT1 (SUTURE) ×2 IMPLANT
SUT VIC AB 3-0 SH 27 (SUTURE)
SUT VIC AB 3-0 SH 27X BRD (SUTURE) IMPLANT
SUT VICRYL 0 TIES 12 18 (SUTURE) ×2 IMPLANT
SYR 50ML LL SCALE MARK (SYRINGE) IMPLANT
SYR CONTROL 10ML LL (SYRINGE) ×1 IMPLANT
SYR TB 1ML 25GX5/8 (SYRINGE) IMPLANT
TOWEL OR 17X24 6PK STRL BLUE (TOWEL DISPOSABLE) ×4 IMPLANT
TRAY FOLEY CATH SILVER 14FR (SET/KITS/TRAYS/PACK) ×2 IMPLANT
WATER STERILE IRR 1000ML POUR (IV SOLUTION) ×2 IMPLANT

## 2016-05-13 NOTE — Op Note (Signed)
05/13/2016  11:40 AM  PATIENT:  Kimberly Snow  50 y.o. female MRN:  [id number]  PRE-OPERATIVE DIAGNOSIS:  Uterine Fibroids, Menorrhagia  POST-OPERATIVE DIAGNOSIS:  Uterine Fibroids, Menorrhagia  PROCEDURE:  Procedure(s): HYSTERECTOMY ABDOMINAL WITH SALPINGECTOMY with iud removal  SURGEON:  Surgeon(s): Eldred Manges, MD  ASSISTANTS: Earnstine Regal PA-C   ANESTHESIA:  General  ESTIMATED BLOOD LOSS: 123456 cc  COMPLICATIONS:none  FINDINGS:  The uterus was enlarged to 18 weeks sized and weighed more than 800 gms.  The tubes were s/p tubal sterilization with only the fimbriated ends remaining on each side.  The ovaries were normal bilaterally.    BLOOD ADMINISTERED:none  DRAINS: Urinary Catheter (Foley)   LOCAL MEDICATIONS USED:  MARCAINE    and Amount: 20 ml  SPECIMEN:  Source of Specimen:  uterus, cervix, portion of right fallopian tube with suture, portion of left fallopian tube.  DISPOSITION OF SPECIMEN:  PATHOLOGY  COUNTS:  YES  PROCEDURE: The patient was taken to the operating room after appropriate identification and placed on the operating table in the supine position. Equipment for the induction of general anesthesia was placed and after the attainment of adequate general anesthesia the abdomen, perineum, and vagina were prepped with multiple layers of Betadine.a Foley catheter was inserted into the bladder under sterile conditions and connected to straight drainage.  A time out was undertaken.   The abdomen was draped as a sterile field.  Suprapubic injection of 20 cc of quarter percent Marcaine was undertaken. A suprapubic transverse incision was made and the abdomen opened in layers. Peritoneum was entered and a self-retaining retractor placed in the abdominal cavity after manual examination of the pelvis and upper abdomen was done. A bladder blade was placed. Large Kelly clamps were placed at the cornual regions of the uterus. The left round ligament was then  identified clamped cut and suture ligated. That incision was taken anteriorly on the anterior leaf of the broad ligament. The utero-ovarian ligament was identified clamped cut, free tied and suture-ligated. A similar procedure was carried out on the opposite side with the round ligament and utero-ovarian ligament. The bladder flap was completed on the opposite side with incision of the anterior leaf of the broad ligament. The bladder was dissected off the anterior cervix with a combination of blunt and sharp dissection. The uterine arteries on the right and left side were clamped cut and suture ligated. The uterine fundus was excised and removed from the operative field with improved visualization.  Parametial tissues on the right and left side were clamped, cut and suture-ligated down to the level of the cervix. The cervical tissues were then clamped cut and suture ligated. Uterosacral ligaments were clamped cut suture-ligated and those sutures held. The vaginal angles were clamped cut suture ligated and the sutures held. The remainder of the vagina was incised and the uterus and cervix were removed from the operative field. Vaginal cuff was closed with figure-of-eight sutures of 0-Vicryl. Copious irrigation was carried out. The sutures holding the vaginal angles and uterosacral ligaments were then tied together.  The right portion of tube was clamped at the mesosalpinx and excised, then suture ligated.  A similar procedure was carried out on the opposite side, with both tubes removed from the operative field.  Hemostasis was noted to be adequate and all instruments were removed from the peritoneal cavity.  The abdominal peritoneum was closed using running suture of 2-0 Vicryl.  The rectus fascia was closed with running sutures of 0-Vicryl from each  apex to  the midline and tied in the midline. The subcutaneous tissue was made hemostatic with Bovie cautery and irrigated, then reapproximated with interrupted  sutures of 0-Vicryl. The skin incision was closed with a subcuticular suture of 3-0 Monocryl, and that covered with Dermabond.   Sterile dressing was applied. The patient was awakened from general anesthesia and taken to the recovery room in satisfactory condition having tolerated the procedure well with  sponge and instrument counts correct.  PLAN OF CARE: Admit to floor after post anesthesia care  PATIENT DISPOSITION:  PACU - hemodynamically stable.   Delay start of Pharmacological VTE agent (>24hrs) due to surgical blood loss or risk of bleeding:  Yes.  SCD's used throughout the surgery and post op period,.   Sarah Baez P 11:40 AM

## 2016-05-13 NOTE — H&P (Signed)
History and Physical Interval Note:   05/13/2016   7:18 AM   Kimberly Snow  has presented today for surgery, with the diagnosis of Uterine Fibroids, Menorrhagia  The various methods of treatment have been discussed with the patient and family. After consideration of risks, benefits and other options for treatment, the patient has consented to  Procedure(s): HYSTERECTOMY ABDOMINAL WITH SALPINGECTOMY as a surgical intervention .  I have reviewed the patients' chart and labs.  Questions were answered to the patient's satisfaction.  We discussed the patient's remote hx of CAD.  Since that time the patient has had intentional weight loss which has been maintained and she has been totally asymptomatic. Today's ECG is read as NSR, possible left atrial enlargement.  BP 130/81   Pulse 86   Temp 98.3 F (36.8 C) (Oral)   Resp 18   SpO2 100%   The patient wants to proceed with  Surgery today and acknowledges the risks reviewed with the anesthesiologist..   Eldred Manges  MD

## 2016-05-13 NOTE — Transfer of Care (Signed)
Immediate Anesthesia Transfer of Care Note  Patient: Kimberly Snow  Procedure(s) Performed: Procedure(s) with comments: HYSTERECTOMY ABDOMINAL WITH SALPINGECTOMY with iud removal (Bilateral) - 3 Hours  Patient Location: PACU  Anesthesia Type:General  Level of Consciousness: awake, alert  and oriented  Airway & Oxygen Therapy: Patient Spontanous Breathing and Patient connected to nasal cannula oxygen  Post-op Assessment: Report given to RN, Post -op Vital signs reviewed and stable and Patient moving all extremities X 4  Post vital signs: Reviewed and stable  Last Vitals:  Vitals:   05/13/16 0617  BP: 130/81  Pulse: 86  Resp: 18  Temp: 36.8 C    Last Pain:  Vitals:   05/13/16 0617  TempSrc: Oral  PainSc: 5       Patients Stated Pain Goal: 5 ([id number])  Complications: No apparent anesthesia complications

## 2016-05-13 NOTE — Progress Notes (Signed)
Day of Surgery Procedure(s) (LRB): HYSTERECTOMY ABDOMINAL WITH SALPINGECTOMY with iud removal (Bilateral)  Subjective: Patient reports nausea and a single episode of vomiting.Incisional pain is well controlled with PCA, but it may be contributing to nausea.  She is not yet tolerating PO.  She has ambulated twice with minimal dizziness which also contributed to nausea  Objective: I have reviewed patient's vital signs, intake and output and medications.  General: alert, cooperative and no distress Resp: clear to auscultation bilaterally Cardio: regular rate and rhythm, S1, S2 normal, no murmur, click, rub or gallop GI: soft, non-tender; bowel sounds normal; no masses,  no organomegaly and incision: dressing is clean, dry and intact Extremities: extremities normal, atraumatic, no cyanosis or edema Vaginal Bleeding: minimal  Assessment: s/p Procedure(s) with comments: HYSTERECTOMY ABDOMINAL WITH SALPINGECTOMY with iud removal (Bilateral) - 3 Hours: stable and not tolerating diet  Plan: Advance diet Encourage ambulation Advance to PO medication as soon as pt is able to tolerate.  LOS: 0 days    [name],VANESSA P 05/13/2016, 6:52 PM

## 2016-05-13 NOTE — Anesthesia Postprocedure Evaluation (Signed)
Anesthesia Post Note  Patient: Kimberly Snow  Procedure(s) Performed: Procedure(s) (LRB): HYSTERECTOMY ABDOMINAL WITH SALPINGECTOMY with iud removal (Bilateral)  Patient location during evaluation: PACU Anesthesia Type: General Level of consciousness: awake and alert Pain management: pain level controlled Vital Signs Assessment: post-procedure vital signs reviewed and stable Respiratory status: spontaneous breathing, nonlabored ventilation, respiratory function stable and patient connected to nasal cannula oxygen Cardiovascular status: blood pressure returned to baseline and stable Postop Assessment: no signs of nausea or vomiting Anesthetic complications: no     Last Vitals:  Vitals:   05/13/16 1300 05/13/16 1315  BP: 137/74 140/81  Pulse: 69 88  Resp: 16 (!) 26  Temp:      Last Pain:  Vitals:   05/13/16 1315  TempSrc:   PainSc: 3    Pain Goal: Patients Stated Pain Goal: 5 (05/13/16 1315)               Effie Berkshire

## 2016-05-13 NOTE — Anesthesia Procedure Notes (Signed)
Procedure Name: Intubation Performed by: Jonna Munro Pre-anesthesia Checklist: Suction available, Emergency Drugs available, Timeout performed, Patient identified and Patient being monitored Patient Re-evaluated:Patient Re-evaluated prior to inductionOxygen Delivery Method: Circle system utilized Preoxygenation: Pre-oxygenation with 100% oxygen Intubation Type: IV induction Ventilation: Mask ventilation without difficulty Laryngoscope Size: Mac and 3 Grade View: Grade I Tube type: Oral Tube size: 7.0 mm Number of attempts: 1 Placement Confirmation: ETT inserted through vocal cords under direct vision,  positive ETCO2 and breath sounds checked- equal and bilateral Secured at: 22 cm Tube secured with: Tape Dental Injury: Teeth and Oropharynx as per pre-operative assessment

## 2016-05-13 NOTE — Progress Notes (Signed)
Was called into the room by pt son. Pt stated she was having a panic attack and was very hot. Pt insisted on having SCD's removed. Pt feeling very nauseous. Pt given prn meds to help with anxiety and nausea which were effective

## 2016-05-14 ENCOUNTER — Encounter (HOSPITAL_COMMUNITY): Payer: Self-pay | Admitting: Obstetrics and Gynecology

## 2016-05-14 LAB — CBC
HCT: 23.5 % — ABNORMAL LOW (ref 36.0–46.0)
HEMOGLOBIN: 7.4 g/dL — AB (ref 12.0–15.0)
MCH: 21.8 pg — AB (ref 26.0–34.0)
MCHC: 31.5 g/dL (ref 30.0–36.0)
MCV: 69.1 fL — ABNORMAL LOW (ref 78.0–100.0)
Platelets: 251 10*3/uL (ref 150–400)
RBC: 3.4 MIL/uL — ABNORMAL LOW (ref 3.87–5.11)
RDW: 23.5 % — AB (ref 11.5–15.5)
WBC: 13.3 10*3/uL — ABNORMAL HIGH (ref 4.0–10.5)

## 2016-05-14 MED ORDER — ONDANSETRON HCL 4 MG PO TABS: 4.0000 mg | ORAL_TABLET | Freq: Three times a day (TID) | ORAL | 0 refills | Status: DC | PRN

## 2016-05-14 MED ORDER — OXYCODONE-ACETAMINOPHEN 5-325 MG PO TABS: 1.0000 | ORAL_TABLET | ORAL | 0 refills | Status: DC | PRN

## 2016-05-14 MED ORDER — IBUPROFEN 600 MG PO TABS: ORAL_TABLET | ORAL | 1 refills | Status: DC

## 2016-05-14 NOTE — Progress Notes (Signed)
Kimberly Snow is a71 y.o.  [id number]  Post Op Date # 1:  TAH/BS  Subjective: Patient is Doing well postoperatively. Patient has Pain is controlled with current analgesics. Medications being used: prescription NSAID's including Ketorolac 30 mg.. Has ambulated many times in the hall without dizziness or lightheadedness;  has voided and tolerated a regular diet. Patient reported a few  episodes of anxiety while attached to PCA and SCD hose.  Once both removed and she was allowed to walk more freely, those symptoms subsided.    Objective: Vital signs in last 24 hours: Temp:  [97.9 F (36.6 C)-98.8 F (37.1 C)] 98.8 F (37.1 C) (08/29 0604) Pulse Rate:  [53-94] 82 (08/29 0604) Resp:  [16-26] 18 (08/29 0604) BP: (120-154)/(57-81) 125/57 (08/29 0604) SpO2:  [97 %-100 %] 99 % (08/29 0604)  Intake/Output from previous day: 08/28 0701 - 08/29 0700 In: 3240 [P.O.:940; I.V.:2300] Out: 3945 [Urine:3570] Intake/Output this shift: No intake/output data recorded.  Recent Labs Lab 05/14/16 0515  WBC 13.3*  HGB 7.4*  HCT 23.5*  PLT 251   EXAM: General: alert, cooperative and no distress Resp: clear to auscultation bilaterally Cardio: regular rate and rhythm, S1, S2 normal, no murmur, click, rub or gallop GI: Bowel sounds present; Honeycomb dressing intact/clean/dry. Extremities: Homans sign is negative, no sign of DVT and no calf tenderness. Vaginal Bleeding: none   Assessment: s/p Procedure(s): HYSTERECTOMY ABDOMINAL WITH SALPINGECTOMY with iud removal: stable, progressing well, tolerating diet and anemia  Plan: Advance to PO medication Routine care.  Pt requests discharge home as she has achieved benchmarks of voiding, tolerating regular diet and po pain meds with good relief of pain, and is afebrile with stable vs.  Will DC  LOS: 1 day    POWELL,ELMIRA, PA-C 05/14/2016 7:45 AM

## 2016-05-14 NOTE — Progress Notes (Signed)
Assumed care of pt. Detail head to toe assessment done. See flow sheet for details.

## 2016-05-14 NOTE — Discharge Instructions (Signed)
Call [facility] @ [phone] if:  You have a temperature greater than or equal to 100.4 degrees Farenheit orally You have pain that is not made better by the pain medication given and taken as directed You have excessive bleeding or problems urinating  Take Colace (Docusate Sodium/Stool Softener) 100 mg 2-3 times daily while taking narcotic pain medicine to avoid constipation or until bowel movements are regular. Continue iron supplements twice a day for the next 6 months Ibuprofen 600 mg with food, every 6 hours for 5 days then as needed for pain  You may drive after 2  weeks You may walk up steps  You may shower  You may resume a regular diet  Keep incisions clean and dry;  remove honeycomb dressing on May 19, 2016 Do not lift over 15 pounds for 6 weeks Avoid anything in vagina for 6 weeks.  Keep post operative visit with Dr. Leo Grosser on June 20, 2016 at 9:30 a.m.

## 2016-05-14 NOTE — Discharge Summary (Signed)
Physician Discharge Summary  Patient ID: Younique My MRN: [id number] DOB/AGE: 09/21/1965 50 y.o.  Admit date: 05/13/2016 Discharge date: 05/14/2016   Discharge Diagnoses:  Symptomatic Uterine Fibroids, Menorrhagia and Anemia Active Problems:   Menorrhagia now s/p hysterectomy  Anemia Anxiety    Operation: Total Abdominal Hysterectomy with Bilateral Salpingectomy   Discharged Condition: Good  Hospital Course: On the date of admission,  the patient underwent the aforementioned procedures and tolerated them well.  Post operative course was unremarkable with the patient resuming bowel and bladder function by post operative day #1 and was therefore deemed ready for discharge home.  Discharge hemoglobin/hematocrit = 7.4/23.5 respectively.  Disposition: 01-Home or Self Care                        Pt will be referred to a PCP for rountine health maintenance  Discharge Medications:    Medication List    STOP taking these medications   hyoscyamine 0.375 MG 12 hr tablet Commonly known as:  LEVBID   ketorolac 10 MG tablet Commonly known as:  TORADOL     TAKE these medications   Biotin 5000 MCG Tabs Take 5,000 mg by mouth daily.   BLACK CURRANT SEED OIL PO Take 5 mLs by mouth daily.   diphenhydrAMINE 25 MG tablet Commonly known as:  BENADRYL Take 25 mg by mouth every 8 (eight) hours as needed (For allergic reaction.).   ferrous sulfate 325 (65 FE) MG tablet Take 325 mg by mouth daily with breakfast.   ibuprofen 600 MG tablet Commonly known as:  ADVIL,MOTRIN 1  po pc every 6 hours for 5 days then prn-pain   ondansetron 4 MG tablet Commonly known as:  ZOFRAN Take 1 tablet (4 mg total) by mouth every 8 (eight) hours as needed for nausea or vomiting.   oxyCODONE-acetaminophen 5-325 MG tablet Commonly known as:  PERCOCET/ROXICET Take 1-2 tablets by mouth every 4 (four) hours as needed for severe pain (moderate to severe pain (when tolerating fluids)).           Follow-up: Dr. Lorriane Shire P. [name] on June 20, 2016 at 9:30 a.m.   SignedEarnstine Regal, PA-C 05/14/2016, 8:07 AM

## 2016-05-14 NOTE — Progress Notes (Signed)
Assumed care of patient. Discharge instructions provided by RN Lavella Lemons. Pt verbalizes understanding of discharge instructions. Px slip given and pt aware to take to pharmacy for medication.

## 2016-05-14 NOTE — Progress Notes (Signed)
Pt discharged off the unit via ambulatory. Pt escorted with 2 RN out to pt pickup area.

## 2016-05-17 LAB — TYPE AND SCREEN
ABO/RH(D): A POS
ANTIBODY SCREEN: NEGATIVE
UNIT DIVISION: 0
UNIT DIVISION: 0

## 2016-05-17 NOTE — Telephone Encounter (Signed)
TC to pt in preparation for surgery on 05/13/16.  Pt had all her questions answere and wishes to proceed.

## 2016-05-22 ENCOUNTER — Encounter: Payer: Self-pay | Admitting: Nurse Practitioner

## 2016-05-22 ENCOUNTER — Ambulatory Visit (INDEPENDENT_AMBULATORY_CARE_PROVIDER_SITE_OTHER): Payer: BLUE CROSS/BLUE SHIELD | Admitting: Nurse Practitioner

## 2016-05-22 ENCOUNTER — Other Ambulatory Visit (INDEPENDENT_AMBULATORY_CARE_PROVIDER_SITE_OTHER): Payer: BLUE CROSS/BLUE SHIELD

## 2016-05-22 VITALS — BP 142/80 | HR 88 | Temp 98.1°F | Ht 69.0 in | Wt 214.0 lb

## 2016-05-22 DIAGNOSIS — R6889 Other general symptoms and signs: Secondary | ICD-10-CM | POA: Diagnosis not present

## 2016-05-22 DIAGNOSIS — Z90710 Acquired absence of both cervix and uterus: Secondary | ICD-10-CM | POA: Insufficient documentation

## 2016-05-22 DIAGNOSIS — R079 Chest pain, unspecified: Secondary | ICD-10-CM

## 2016-05-22 DIAGNOSIS — E878 Other disorders of electrolyte and fluid balance, not elsewhere classified: Secondary | ICD-10-CM | POA: Diagnosis not present

## 2016-05-22 DIAGNOSIS — Z0001 Encounter for general adult medical examination with abnormal findings: Secondary | ICD-10-CM | POA: Diagnosis not present

## 2016-05-22 DIAGNOSIS — R030 Elevated blood-pressure reading, without diagnosis of hypertension: Secondary | ICD-10-CM

## 2016-05-22 DIAGNOSIS — Z0000 Encounter for general adult medical examination without abnormal findings: Secondary | ICD-10-CM | POA: Diagnosis not present

## 2016-05-22 DIAGNOSIS — IMO0001 Reserved for inherently not codable concepts without codable children: Secondary | ICD-10-CM

## 2016-05-22 DIAGNOSIS — D500 Iron deficiency anemia secondary to blood loss (chronic): Secondary | ICD-10-CM

## 2016-05-22 DIAGNOSIS — F410 Panic disorder [episodic paroxysmal anxiety] without agoraphobia: Secondary | ICD-10-CM | POA: Insufficient documentation

## 2016-05-22 DIAGNOSIS — K5900 Constipation, unspecified: Secondary | ICD-10-CM | POA: Insufficient documentation

## 2016-05-22 LAB — CBC WITH DIFFERENTIAL/PLATELET
BASOS PCT: 0.2 % (ref 0.0–3.0)
Basophils Absolute: 0 10*3/uL (ref 0.0–0.1)
EOS PCT: 0.9 % (ref 0.0–5.0)
Eosinophils Absolute: 0.1 10*3/uL (ref 0.0–0.7)
HCT: 29.4 % — ABNORMAL LOW (ref 36.0–46.0)
Hemoglobin: 9.4 g/dL — ABNORMAL LOW (ref 12.0–15.0)
LYMPHS ABS: 1.4 10*3/uL (ref 0.7–4.0)
Lymphocytes Relative: 12.3 % (ref 12.0–46.0)
MCHC: 32.1 g/dL (ref 30.0–36.0)
MCV: 72.8 fl — AB (ref 78.0–100.0)
MONOS PCT: 8.2 % (ref 3.0–12.0)
Monocytes Absolute: 0.9 10*3/uL (ref 0.1–1.0)
NEUTROS ABS: 8.8 10*3/uL — AB (ref 1.4–7.7)
Neutrophils Relative %: 78.4 % — ABNORMAL HIGH (ref 43.0–77.0)
PLATELETS: 414 10*3/uL — AB (ref 150.0–400.0)
RBC: 4.04 Mil/uL (ref 3.87–5.11)
RDW: 25.3 % — ABNORMAL HIGH (ref 11.5–15.5)
WBC: 11.3 10*3/uL — ABNORMAL HIGH (ref 4.0–10.5)

## 2016-05-22 LAB — COMPREHENSIVE METABOLIC PANEL
ALK PHOS: 53 U/L (ref 39–117)
ALT: 16 U/L (ref 0–35)
AST: 20 U/L (ref 0–37)
Albumin: 4 g/dL (ref 3.5–5.2)
BUN: 11 mg/dL (ref 6–23)
CALCIUM: 8.9 mg/dL (ref 8.4–10.5)
CO2: 31 meq/L (ref 19–32)
Chloride: 102 mEq/L (ref 96–112)
Creatinine, Ser: 0.74 mg/dL (ref 0.40–1.20)
GFR: 106.96 mL/min (ref >60.00)
GLUCOSE: 95 mg/dL (ref 70–99)
POTASSIUM: 3.5 meq/L (ref 3.5–5.1)
Sodium: 138 mEq/L (ref 135–145)
Total Bilirubin: 0.6 mg/dL (ref 0.2–1.2)
Total Protein: 7.5 g/dL (ref 6.0–8.3)

## 2016-05-22 LAB — LIPID PANEL
CHOL/HDL RATIO: 5
CHOLESTEROL: 221 mg/dL — AB (ref 0–200)
HDL: 43.4 mg/dL (ref >39.00)
LDL Cholesterol: 145 mg/dL — ABNORMAL HIGH (ref 0–99)
NonHDL: 178.04
TRIGLYCERIDES: 166 mg/dL — AB (ref 0.0–149.0)
VLDL: 33.2 mg/dL (ref 0.0–40.0)

## 2016-05-22 LAB — TSH: TSH: 1.37 u[IU]/mL (ref 0.35–4.50)

## 2016-05-22 LAB — HEMOGLOBIN A1C: Hgb A1c MFr Bld: 5.3 % (ref 4.6–6.5)

## 2016-05-22 NOTE — Progress Notes (Signed)
Subjective:    Patient ID: Kimberly Snow, female    DOB: May 29, 1966, 50 y.o.   MRN: [id number]  Patient presents today for complete physical or establish care (new patient). Denies any acute complains  Chest Pain   This is a chronic problem. The current episode started more than 1 year ago. The onset quality is gradual. The problem occurs intermittently. The problem has been unchanged. The pain is present in the substernal region. The pain is at a severity of 3/10. The pain is mild. The quality of the pain is described as tightness. The pain does not radiate. Pertinent negatives include no back pain, cough, diaphoresis, exertional chest pressure, fever, irregular heartbeat, leg pain, lower extremity edema, malaise/fatigue, nausea, near-syncope, numbness, orthopnea, palpitations, PND, shortness of breath, sputum production, syncope, vomiting or weakness. She has tried nothing for the symptoms. Risk factors include stress.  Her family medical history is significant for CAD, diabetes, heart disease, hyperlipidemia and hypertension.  Pertinent negatives for family medical history include: no early MI, no stroke and no sudden death. Prior diagnostic workup includes echocardiogram and exercise treadmill test (work up done 2010 by cardiology, needs referral to continue f/up with cardiology as recommended in past.).    Immunizations: (TDAP, Hep C screen, Pneumovax, Influenza, zoster)  Health Maintenance  Topic Date Due  . Tetanus Vaccine  09/01/1985  . Pap Smear  09/02/1987  . Flu Shot  04/16/2016  . HIV Screening  Completed   Diet:healthy Weight: stable Wt Readings from Last 3 Encounters:  05/22/16 214 lb (97.1 kg)  05/06/16 216 lb 8 oz (98.2 kg)  09/08/15 221 lb 6.4 oz (100.4 kg)   Exercise:none at this time due to surgery  Depression/Suicide:none No flowsheet data found. No flowsheet data found. Colonoscopy (every 5-10yr, >50-790yr:needed Mammogram (yearly,  >4513yrneeded  Advanced Directive: Advanced Directives 05/13/2016  Does patient have an advance directive? No  Would patient like information on creating an advanced directive? -   Sexual History (birth control, marital status, STD):single , not sexually active at this time due to surgery.  Medications and allergies reviewed with patient and updated if appropriate.  Patient Active Problem List   Diagnosis Date Noted  . S/P abdominal hysterectomy 05/22/2016  . Chest pain 05/22/2016  . Elevated BP 05/22/2016  . Constipation 05/22/2016  . Panic attacks 05/22/2016  . Anemia 09/08/2015    Current Outpatient Prescriptions on File Prior to Visit  Medication Sig Dispense Refill  . Biotin 5000 MCG TABS Take 5,000 mg by mouth daily.     . BMarland KitchenACK CURRANT SEED OIL PO Take 5 mLs by mouth daily.    . diphenhydrAMINE (BENADRYL) 25 MG tablet Take 25 mg by mouth every 8 (eight) hours as needed (For allergic reaction.).    . fMarland Kitchenrrous sulfate 325 (65 FE) MG tablet Take 325 mg by mouth daily with breakfast.    . ibuprofen (ADVIL,MOTRIN) 600 MG tablet 1  po pc every 6 hours for 5 days then prn-pain 30 tablet 1  . ondansetron (ZOFRAN) 4 MG tablet Take 1 tablet (4 mg total) by mouth every 8 (eight) hours as needed for nausea or vomiting. 20 tablet 0  . oxyCODONE-acetaminophen (PERCOCET/ROXICET) 5-325 MG tablet Take 1-2 tablets by mouth every 4 (four) hours as needed for severe pain (moderate to severe pain (when tolerating fluids)). (Patient not taking: Reported on 05/22/2016) 30 tablet 0   No current facility-administered medications on file prior to visit.     Past Medical History:  Diagnosis Date  .  Abnormal vaginal bleeding   . Anemia   . Anxiety   . Blockage of coronary artery of heart (HCC)    was supposed to have a stent placed, but left hospital  . Depression   . Fibroids   . Headache    migraines  . Menorrhagia 05/12/2016    Past Surgical History:  Procedure Laterality Date  .  ABDOMINAL HYSTERECTOMY  05/13/2017   ovaries still present.  Marland Kitchen HYSTERECTOMY ABDOMINAL WITH SALPINGECTOMY Bilateral 05/13/2016   Procedure: HYSTERECTOMY ABDOMINAL WITH SALPINGECTOMY with iud removal;  Surgeon: Eldred Manges, MD;  Location: [facility];  Service: Gynecology;  Laterality: Bilateral;  3 Hours  . TUBAL LIGATION    . WISDOM TOOTH EXTRACTION      Social History   Social History  . Marital status: Legally Separated    Spouse name: N/A  . Number of children: N/A  . Years of education: N/A   Social History Main Topics  . Smoking status: Never Smoker  . Smokeless tobacco: Never Used  . Alcohol use No  . Drug use: No  . Sexual activity: Yes    Birth control/ protection: Surgical     Comment: ABD hysterectomy without ovaries   Other Topics Concern  . None   Social History Narrative  . None    Family History  Problem Relation Age of Onset  . Diabetes Mother   . Hypertension Mother   . Alcohol abuse Father   . Cancer Father   . Hypertension Brother   . Heart disease Paternal Uncle   . Hypertension Paternal Uncle   . Hyperlipidemia Paternal Uncle         Review of Systems  Constitutional: Negative for chills, diaphoresis, fever and malaise/fatigue.  HENT: Negative.   Respiratory: Negative for cough, sputum production and shortness of breath.   Cardiovascular: Positive for chest pain. Negative for palpitations, orthopnea, syncope, PND and near-syncope.  Gastrointestinal: Positive for constipation. Negative for blood in stool, diarrhea, melena, nausea and vomiting.  Genitourinary: Negative.   Musculoskeletal: Negative for back pain, falls, joint pain and myalgias.  Skin: Negative.   Neurological: Negative.  Negative for weakness and numbness.  Endo/Heme/Allergies: Negative.   Psychiatric/Behavioral: Negative for depression and suicidal ideas. The patient is nervous/anxious.     Objective:   Vitals:   05/22/16 1123  BP: (!) 142/80  Pulse: 88  Temp: 98.1  F (36.7 C)    Body mass index is 31.6 kg/m.   Physical Examination:  Physical Exam  Constitutional: She is oriented to person, place, and time and well-developed, well-nourished, and in no distress.  HENT:  Right Ear: External ear normal.  Left Ear: External ear normal.  Nose: Nose normal.  Mouth/Throat: Oropharynx is clear and moist. No oropharyngeal exudate.  Eyes: Conjunctivae and EOM are normal. Pupils are equal, round, and reactive to light. No scleral icterus.  Neck: Normal range of motion. Neck supple. No thyromegaly present.  Cardiovascular: Normal rate, normal heart sounds and intact distal pulses.   Pulmonary/Chest: Effort normal and breath sounds normal.  Abdominal: Soft. Bowel sounds are normal.  Musculoskeletal: Normal range of motion. She exhibits no edema.  Lymphadenopathy:    She has no cervical adenopathy.  Neurological: She is alert and oriented to person, place, and time. Gait normal.  Skin: Skin is warm and dry.  Surgical incision appears clean and dry, edges well approximated, no drainage, no erythema.  Psychiatric: Affect and judgment normal.  Vitals reviewed.  Recent Results (from the past  2160 hour(s))  CBC     Status: Abnormal   Collection Time: 05/06/16  2:45 PM  Result Value Ref Range   WBC 7.0 4.0 - 10.5 K/uL   RBC 4.65 3.87 - 5.11 MIL/uL   Hemoglobin 10.3 (L) 12.0 - 15.0 g/dL   HCT 31.8 (L) 36.0 - 46.0 %   MCV 68.4 (L) 78.0 - 100.0 fL   MCH 22.2 (L) 26.0 - 34.0 pg   MCHC 32.4 30.0 - 36.0 g/dL   RDW 23.9 (H) 11.5 - 15.5 %   Platelets 282 150 - 400 K/uL  Type and screen [place]     Status: None   Collection Time: 05/06/16  2:45 PM  Result Value Ref Range   ABO/RH(D) A POS    Antibody Screen NEG    Sample Expiration 05/20/2016    Extend sample reason NO TRANSFUSIONS OR PREGNANCY IN THE PAST 3 MONTHS    Unit Number [id number]    Blood Component Type RED CELLS,LR    Unit division 00    Status of Unit REL FROM  [facility]    Transfusion Status OK TO TRANSFUSE    Crossmatch Result Compatible    Unit Number [id number]    Blood Component Type RED CELLS,LR    Unit division 00    Status of Unit REL FROM [facility]    Transfusion Status OK TO TRANSFUSE    Crossmatch Result Compatible   Pregnancy, urine     Status: None   Collection Time: 05/13/16  6:00 AM  Result Value Ref Range   Preg Test, Ur NEGATIVE NEGATIVE    Comment:        THE SENSITIVITY OF THIS METHODOLOGY IS >20 mIU/mL.   Comprehensive metabolic panel     Status: Abnormal   Collection Time: 05/13/16  6:20 AM  Result Value Ref Range   Sodium 133 (L) 135 - 145 mmol/L   Potassium 2.8 (L) 3.5 - 5.1 mmol/L   Chloride 99 (L) 101 - 111 mmol/L   CO2 28 22 - 32 mmol/L   Glucose, Bld 93 65 - 99 mg/dL   BUN 10 6 - 20 mg/dL   Creatinine, Ser 0.68 0.44 - 1.00 mg/dL   Calcium 8.4 (L) 8.9 - 10.3 mg/dL   Total Protein 7.5 6.5 - 8.1 g/dL   Albumin 3.7 3.5 - 5.0 g/dL   AST 16 15 - 41 U/L   ALT 11 (L) 14 - 54 U/L   Alkaline Phosphatase 42 38 - 126 U/L   Total Bilirubin 0.5 0.3 - 1.2 mg/dL   GFR calc non Af Amer >60 >60 mL/min   GFR calc Af Amer >60 >60 mL/min    Comment: (NOTE) The eGFR has been calculated using the CKD EPI equation. This calculation has not been validated in all clinical situations. eGFR's persistently <60 mL/min signify possible Chronic Kidney Disease.    Anion gap 6 5 - 15  Prepare RBC (crossmatch)     Status: None   Collection Time: 05/13/16  6:30 AM  Result Value Ref Range   Order Confirmation ORDER PROCESSED BY BLOOD BANK   CBC     Status: Abnormal   Collection Time: 05/14/16  5:15 AM  Result Value Ref Range   WBC 13.3 (H) 4.0 - 10.5 K/uL   RBC 3.40 (L) 3.87 - 5.11 MIL/uL   Hemoglobin 7.4 (L) 12.0 - 15.0 g/dL   HCT 23.5 (L) 36.0 - 46.0 %   MCV 69.1 (L) 78.0 -  100.0 fL   MCH 21.8 (L) 26.0 - 34.0 pg   MCHC 31.5 30.0 - 36.0 g/dL   RDW 23.5 (H) 11.5 - 15.5 %   Platelets 251 150 - 400 K/uL  CBC w/Diff     Status:  Abnormal   Collection Time: 05/22/16 12:08 PM  Result Value Ref Range   WBC 11.3 (H) 4.0 - 10.5 K/uL   RBC 4.04 3.87 - 5.11 Mil/uL   Hemoglobin 9.4 (L) 12.0 - 15.0 g/dL   HCT 29.4 (L) 36.0 - 46.0 %   MCV 72.8 (L) 78.0 - 100.0 fl   MCHC 32.1 30.0 - 36.0 g/dL   RDW 25.3 (H) 11.5 - 15.5 %   Platelets 414.0 (H) 150.0 - 400.0 K/uL   Neutrophils Relative % 78.4 (H) 43.0 - 77.0 %   Lymphocytes Relative 12.3 12.0 - 46.0 %   Monocytes Relative 8.2 3.0 - 12.0 %   Eosinophils Relative 0.9 0.0 - 5.0 %   Basophils Relative 0.2 0.0 - 3.0 %   Neutro Abs 8.8 (H) 1.4 - 7.7 K/uL   Lymphs Abs 1.4 0.7 - 4.0 K/uL   Monocytes Absolute 0.9 0.1 - 1.0 K/uL   Eosinophils Absolute 0.1 0.0 - 0.7 K/uL   Basophils Absolute 0.0 0.0 - 0.1 K/uL  Comprehensive metabolic panel     Status: None   Collection Time: 05/22/16 12:08 PM  Result Value Ref Range   Sodium 138 135 - 145 mEq/L   Potassium 3.5 3.5 - 5.1 mEq/L   Chloride 102 96 - 112 mEq/L   CO2 31 19 - 32 mEq/L   Glucose, Bld 95 70 - 99 mg/dL   BUN 11 6 - 23 mg/dL   Creatinine, Ser 0.74 0.40 - 1.20 mg/dL   Total Bilirubin 0.6 0.2 - 1.2 mg/dL   Alkaline Phosphatase 53 39 - 117 U/L   AST 20 0 - 37 U/L   ALT 16 0 - 35 U/L   Total Protein 7.5 6.0 - 8.3 g/dL   Albumin 4.0 3.5 - 5.2 g/dL   Calcium 8.9 8.4 - 10.5 mg/dL   GFR 106.96 >60.00 mL/min  TSH     Status: None   Collection Time: 05/22/16 12:08 PM  Result Value Ref Range   TSH 1.37 0.35 - 4.50 uIU/mL  Lipid Profile     Status: Abnormal   Collection Time: 05/22/16 12:08 PM  Result Value Ref Range   Cholesterol 221 (H) 0 - 200 mg/dL    Comment: ATP III Classification       Desirable:  < 200 mg/dL               Borderline High:  200 - 239 mg/dL          High:  > = 240 mg/dL   Triglycerides 166.0 (H) 0.0 - 149.0 mg/dL    Comment: Normal:  <150 mg/dLBorderline High:  150 - 199 mg/dL   HDL 43.40 >39.00 mg/dL   VLDL 33.2 0.0 - 40.0 mg/dL   LDL Cholesterol 145 (H) 0 - 99 mg/dL   Total CHOL/HDL Ratio 5      Comment:                Men          Women1/2 Average Risk     3.4          3.3Average Risk          5.0  4.42X Average Risk          9.6          7.13X Average Risk          15.0          11.0                       NonHDL 178.04     Comment: NOTE:  Non-HDL goal should be 30 mg/dL higher than patient's LDL goal (i.e. LDL goal of < 70 mg/dL, would have non-HDL goal of < 100 mg/dL)  Hemoglobin A1c     Status: None   Collection Time: 05/22/16 12:08 PM  Result Value Ref Range   Hgb A1c MFr Bld 5.3 4.6 - 6.5 %    Comment: Glycemic Control Guidelines for People with Diabetes:Non Diabetic:  <6%Goal of Therapy: <7%Additional Action Suggested:  >8%    ASSESSMENT and PLAN:  Kimberly Snow was seen today for establish care.  Diagnoses and all orders for this visit:  Encounter for preventative adult health care exam with abnormal findings -     Comprehensive metabolic panel; Future -     TSH; Future -     Lipid Profile; Future -     Hemoglobin A1c; Future  Iron deficiency anemia due to chronic blood loss -     CBC w/Diff; Future  S/P abdominal hysterectomy  Chest pain, unspecified chest pain type -     TSH; Future -     Lipid Profile; Future -     Ambulatory referral to Cardiology  Elevated BP  Constipation, unspecified constipation type  Panic attacks  Electrolyte imbalance -     Comprehensive metabolic panel; Future    S/P abdominal hysterectomy 04/2016 Ovaries still present. fallopian tubes removed. Upcoming f/up with surgeon in 6weeks. No signs of incision infection.  Elevated BP 99yr CVD risk of 4.2%: due to recent surgical procedure, will repeat lipid panel in 648monthto re eval need for statin. Monitor BP at this time  Return in 41m13monthChest pain [facility]. Referral to cardiology ordered today. 10y61yrD risk of 4.2%: due to recent surgical procedure,  will repeat lipid panel in 41mon141monthre eval need for statin. Monitor BP at this time  Return in 41mont50monthrepeat lipid panel and re eval of BP.  Panic attacks Stable She is not interested in use of medication at this time. Use of self talk, positive feed back and prayers at this time.  Constipation Secondary to ferrous sulfate use. Managed with increased fiber intake. Advise about use of colace or miralax OTC prn.  Anemia improved Continue ferrous sulfate or Ferralet once a day. Encourage iron rich diet.     Follow up: Return in about 6 months (around 11/19/2016), or if symptoms worsen or fail to improve, for BP.  CharloWilfred Lacy

## 2016-05-22 NOTE — Assessment & Plan Note (Addendum)
improved Continue ferrous sulfate or Ferralet once a day. Encourage iron rich diet.

## 2016-05-22 NOTE — Assessment & Plan Note (Signed)
Secondary to ferrous sulfate use. Managed with increased fiber intake. Advise about use of colace or miralax OTC prn.

## 2016-05-22 NOTE — Assessment & Plan Note (Signed)
Stable She is not interested in use of medication at this time. Use of self talk, positive feed back and prayers at this time.

## 2016-05-22 NOTE — Assessment & Plan Note (Addendum)
Cardiology evaluation done 2010 (through [facility]). She was to return in 1year but did not. Referral to cardiology ordered today. 16yrs CVD risk of 4.2%: due to recent surgical procedure, will repeat lipid panel in 64months to re eval need for statin. Monitor BP at this time  Return in 19months for repeat lipid panel and re eval of BP.

## 2016-05-22 NOTE — Patient Instructions (Signed)
Maintain f/up with surgeon. Call surgeon if develops any sign of incision infection. You will be called with cardiology appt. Check BP at home at record. Return to office sooner if BP >150/90.

## 2016-05-22 NOTE — Assessment & Plan Note (Addendum)
04/2016 Ovaries still present. fallopian tubes removed. Upcoming f/up with surgeon in 6weeks. No signs of incision infection.

## 2016-05-22 NOTE — Assessment & Plan Note (Addendum)
71yrs CVD risk of 4.2%: due to recent surgical procedure, will repeat lipid panel in 103months to re eval need for statin. Monitor BP at this time  Return in 58months

## 2016-06-07 ENCOUNTER — Encounter (HOSPITAL_COMMUNITY): Payer: Self-pay

## 2016-06-07 ENCOUNTER — Inpatient Hospital Stay (HOSPITAL_COMMUNITY)
Admission: AD | Admit: 2016-06-07 | Discharge: 2016-06-07 | Disposition: A | Discharge status: Home / Self Care | Payer: BLUE CROSS/BLUE SHIELD | Source: Ambulatory Visit | Attending: Obstetrics & Gynecology | Admitting: Obstetrics & Gynecology

## 2016-06-07 DIAGNOSIS — Z90710 Acquired absence of both cervix and uterus: Secondary | ICD-10-CM

## 2016-06-07 DIAGNOSIS — R109 Unspecified abdominal pain: Secondary | ICD-10-CM | POA: Diagnosis present

## 2016-06-07 DIAGNOSIS — T8131XA Disruption of external operation (surgical) wound, not elsewhere classified, initial encounter: Secondary | ICD-10-CM | POA: Diagnosis not present

## 2016-06-07 DIAGNOSIS — T8130XA Disruption of wound, unspecified, initial encounter: Secondary | ICD-10-CM | POA: Diagnosis not present

## 2016-06-07 LAB — URINALYSIS, ROUTINE W REFLEX MICROSCOPIC
BILIRUBIN URINE: NEGATIVE
GLUCOSE, UA: NEGATIVE mg/dL
KETONES UR: NEGATIVE mg/dL
Leukocytes, UA: NEGATIVE
Nitrite: NEGATIVE
PH: 5 (ref 5.0–8.0)
Protein, ur: NEGATIVE mg/dL
Specific Gravity, Urine: 1.01 (ref 1.005–1.030)

## 2016-06-07 LAB — URINE MICROSCOPIC-ADD ON

## 2016-06-07 MED ORDER — MUPIROCIN CALCIUM 2 % EX CREA: 1.0000 "application " | TOPICAL_CREAM | Freq: Two times a day (BID) | CUTANEOUS | 0 refills | Status: DC

## 2016-06-07 NOTE — MAU Note (Addendum)
Had abd hysterectomy 05/13/16. I notice an odor from incision and noticed couple pink areas on incision. Denies vag bleeding or d/c

## 2016-06-07 NOTE — MAU Provider Note (Signed)
History     CSN: [id number]  Arrival date and time: 06/07/16 1925   First Provider Initiated Contact with Patient 06/07/16 2048      Chief Complaint  Patient presents with  . Abdominal Pain   HPI Kimberly Snow is a 50 y.o. G2P1011 presenting with odor at the edge of her incision and concern for pinkness on the right side. Denies fever, chills, N/V, and abdominal pain. She has a TAH on 8/28 with [name]. She noted drainage and odor from the left starting a couple days ago. She wants to have it "checked out"  OB History    Gravida Para Term Preterm AB Living   2 1 1   1 1    SAB TAB Ectopic Multiple Live Births     1            Past Medical History:  Diagnosis Date  . Abnormal vaginal bleeding   . Anemia   . Anxiety   . Blockage of coronary artery of heart (HCC)    was supposed to have a stent placed, but left hospital  . Fibroids   . Menorrhagia 05/12/2016    Past Surgical History:  Procedure Laterality Date  . ABDOMINAL HYSTERECTOMY  05/13/2017   ovaries still present.  Marland Kitchen HYSTERECTOMY ABDOMINAL WITH SALPINGECTOMY Bilateral 05/13/2016   Procedure: HYSTERECTOMY ABDOMINAL WITH SALPINGECTOMY with iud removal;  Surgeon: Eldred Manges, MD;  Location: [facility];  Service: Gynecology;  Laterality: Bilateral;  3 Hours  . TUBAL LIGATION    . WISDOM TOOTH EXTRACTION      Family History  Problem Relation Age of Onset  . Diabetes Mother   . Hypertension Mother   . Alcohol abuse Father   . Cancer Father   . Hypertension Brother   . Heart disease Paternal Uncle   . Hypertension Paternal Uncle   . Hyperlipidemia Paternal Uncle     Social History  Substance Use Topics  . Smoking status: Never Smoker  . Smokeless tobacco: Never Used  . Alcohol use No    Allergies:  Allergies  Allergen Reactions  . Medroxyprogesterone Anxiety    Prescriptions Prior to Admission  Medication Sig Dispense Refill Last Dose  . ferrous sulfate 325 (65 FE) MG tablet Take 325 mg by mouth  daily with breakfast.   06/06/2016 at Unknown time  . hydrOXYzine (ATARAX/VISTARIL) 50 MG tablet Take 50 mg by mouth every 6 (six) hours as needed.   Past Week at Unknown time  . ibuprofen (ADVIL,MOTRIN) 600 MG tablet 1  po pc every 6 hours for 5 days then prn-pain (Patient not taking: Reported on 06/07/2016) 30 tablet 1 Not Taking at Unknown time  . ondansetron (ZOFRAN) 4 MG tablet Take 1 tablet (4 mg total) by mouth every 8 (eight) hours as needed for nausea or vomiting. (Patient not taking: Reported on 06/07/2016) 20 tablet 0 Not Taking at Unknown time  . oxyCODONE-acetaminophen (PERCOCET/ROXICET) 5-325 MG tablet Take 1-2 tablets by mouth every 4 (four) hours as needed for severe pain (moderate to severe pain (when tolerating fluids)). (Patient not taking: Reported on 06/07/2016) 30 tablet 0 Not Taking at Unknown time    Review of Systems  Constitutional: Negative for chills and fever.  Eyes: Negative for blurred vision and double vision.  Respiratory: Negative for cough and shortness of breath.   Cardiovascular: Negative for chest pain and orthopnea.  Gastrointestinal: Negative for nausea and vomiting.  Genitourinary: Negative for dysuria, flank pain and frequency.  Musculoskeletal: Negative for myalgias.  Skin: Negative for rash.  Neurological: Negative for dizziness, tingling, weakness and headaches.  Endo/Heme/Allergies: Does not bruise/bleed easily.  Psychiatric/Behavioral: Negative for depression and suicidal ideas. The patient is not nervous/anxious.    Physical Exam   Blood pressure 163/76, pulse 83, resp. rate 18, height 5' 9.5" (1.765 m), weight 215 lb 1.9 oz (97.6 kg), last menstrual period 07/30/2015.  Physical Exam  Nursing note and vitals reviewed. Constitutional: She is oriented to person, place, and time. She appears well-developed and well-nourished. No distress.  HENT:  Head: Normocephalic and atraumatic.  Eyes: Conjunctivae are normal. No scleral icterus.  Neck: Normal  range of motion. Neck supple.  Cardiovascular: Normal rate and intact distal pulses.   Respiratory: Effort normal. She exhibits no tenderness.  GI: Soft. There is no tenderness. There is no rebound and no guarding.  Well healed scar. New pink skin in midline and right side of incision.  Left lateral edge with 1-2 mm disruption that is only 1-39mm in depth (probed with sterile Q-tip). No surrounding erythema or warm. No foul odor  Genitourinary: Vagina normal.  Musculoskeletal: Normal range of motion. She exhibits no edema.  Neurological: She is alert and oriented to person, place, and time.  Skin: Skin is warm and dry. No rash noted.  Psychiatric: She has a normal mood and affect.    MAU Course  Procedures  MDM  9:02 PM spoke with Dr. Alesia Richards about the plan of care and she was in agreement.   Assessment and Plan   #Wound disruption Wash the area of your incision thorough with soap an warm water once daily Use peroxide on this left edge Apply bactroban (or neosporin) to the area on the left most edge of your incision daily Apply clean gauze daily Return for fever, chills, worsening pain, redness or worsening drainage.  Juanita Craver [facility] 06/07/2016, 8:48 PM

## 2016-06-07 NOTE — Discharge Instructions (Signed)
Wash the area of your incision thorough with soap an warm water once daily Use peroxide on this left edge Apply bactroban (or neosporin) to the area on the left most edge of your incision daily Apply clean gauze daily   Return for fever, chills, worsening pain, redness or worsening drainage. You can also call your doctors office with any questions

## 2016-06-11 ENCOUNTER — Ambulatory Visit: Payer: BLUE CROSS/BLUE SHIELD | Admitting: Cardiology

## 2016-06-14 ENCOUNTER — Ambulatory Visit: Payer: BLUE CROSS/BLUE SHIELD | Admitting: Physician Assistant

## 2016-10-09 DIAGNOSIS — N941 Unspecified dyspareunia: Secondary | ICD-10-CM | POA: Insufficient documentation

## 2016-11-01 ENCOUNTER — Telehealth: Payer: Self-pay | Admitting: Nurse Practitioner

## 2016-11-01 DIAGNOSIS — R079 Chest pain, unspecified: Secondary | ICD-10-CM

## 2016-11-01 NOTE — Telephone Encounter (Signed)
You put a referral in for patient to see cardiologist last year but she was unable to go due to no insurance. She is ready to see them now and is requesting a new referral. Please advise.

## 2016-11-07 ENCOUNTER — Encounter: Payer: Self-pay | Admitting: Cardiology

## 2016-11-07 ENCOUNTER — Encounter (INDEPENDENT_AMBULATORY_CARE_PROVIDER_SITE_OTHER): Payer: Self-pay

## 2016-11-07 ENCOUNTER — Ambulatory Visit (INDEPENDENT_AMBULATORY_CARE_PROVIDER_SITE_OTHER): Payer: BLUE CROSS/BLUE SHIELD | Admitting: Cardiology

## 2016-11-07 VITALS — BP 164/92 | HR 81 | Ht 69.0 in | Wt 229.2 lb

## 2016-11-07 DIAGNOSIS — E785 Hyperlipidemia, unspecified: Secondary | ICD-10-CM

## 2016-11-07 DIAGNOSIS — E669 Obesity, unspecified: Secondary | ICD-10-CM

## 2016-11-07 DIAGNOSIS — Z79899 Other long term (current) drug therapy: Secondary | ICD-10-CM | POA: Diagnosis not present

## 2016-11-07 DIAGNOSIS — R0681 Apnea, not elsewhere classified: Secondary | ICD-10-CM

## 2016-11-07 DIAGNOSIS — R0609 Other forms of dyspnea: Secondary | ICD-10-CM

## 2016-11-07 DIAGNOSIS — R0600 Dyspnea, unspecified: Secondary | ICD-10-CM

## 2016-11-07 DIAGNOSIS — I10 Essential (primary) hypertension: Secondary | ICD-10-CM | POA: Insufficient documentation

## 2016-11-07 DIAGNOSIS — E663 Overweight: Secondary | ICD-10-CM

## 2016-11-07 DIAGNOSIS — E782 Mixed hyperlipidemia: Secondary | ICD-10-CM | POA: Insufficient documentation

## 2016-11-07 DIAGNOSIS — I2510 Atherosclerotic heart disease of native coronary artery without angina pectoris: Secondary | ICD-10-CM

## 2016-11-07 DIAGNOSIS — R0602 Shortness of breath: Secondary | ICD-10-CM

## 2016-11-07 DIAGNOSIS — G4730 Sleep apnea, unspecified: Secondary | ICD-10-CM | POA: Insufficient documentation

## 2016-11-07 DIAGNOSIS — E7800 Pure hypercholesterolemia, unspecified: Secondary | ICD-10-CM | POA: Insufficient documentation

## 2016-11-07 HISTORY — DX: Atherosclerotic heart disease of native coronary artery without angina pectoris: I25.10

## 2016-11-07 MED ORDER — LISINOPRIL-HYDROCHLOROTHIAZIDE 10-12.5 MG PO TABS: 1.0000 | ORAL_TABLET | Freq: Every day | ORAL | 5 refills | Status: DC

## 2016-11-07 NOTE — Assessment & Plan Note (Signed)
LDL 145 on Lipid panel in Sept 2017

## 2016-11-07 NOTE — Assessment & Plan Note (Signed)
This history is not clear and I could find no records documenting CAD

## 2016-11-07 NOTE — Patient Instructions (Signed)
Medication Instructions:  START LISINOPRIL-HCTZ 10-12.5MG  DAILY  If you need a refill on your cardiac medications before your next appointment, please call your pharmacy.  Labwork: HAVE BMP DONE WHEN YOU COME IN FOR STRESS ECHO  Testing/Procedures: Your physician has requested that you have a stress echocardiogram. For further information please visit [email or web address]. Please follow instruction sheet as given.  Your physician has recommended that you have a sleep study. This test records several body functions during sleep, including: brain activity, eye movement, oxygen and carbon dioxide blood levels, heart rate and rhythm, breathing rate and rhythm, the flow of air through your mouth and nose, snoring, body muscle movements, and chest and belly movement.   Follow-Up: Your physician recommends that you schedule a follow-up appointment : AFTER SLEEP STUDY WITH DR Claiborne Billings  Thank you for choosing [facility] at [facility]!!    Kerby Moors, LPN

## 2016-11-07 NOTE — Assessment & Plan Note (Signed)
BMI 33, her wgt has gradually come down over the past year

## 2016-11-07 NOTE — Assessment & Plan Note (Signed)
>>  ASSESSMENT AND PLAN FOR DYSLIPIDEMIA WRITTEN ON 11/07/2016  8:56 AM BY Kerin Ransom K, PA-C  LDL 145 on Lipid panel in Sept 2017

## 2016-11-07 NOTE — Assessment & Plan Note (Signed)
>>  ASSESSMENT AND PLAN FOR OBESITY (BMI 30.0-34.9) WRITTEN ON 11/07/2016  8:56 AM BY KILROY, LUKE K, PA-C  BMI 33, her wgt has gradually come down over the past year

## 2016-11-07 NOTE — Assessment & Plan Note (Signed)
B/P 166/92- add Lisinopril HCTZ 10/12.5

## 2016-11-07 NOTE — Progress Notes (Signed)
11/07/2016 Atarah Grunder   January 08, 1966  [id number]  Primary Physician Wilfred Lacy, NP Primary Cardiologist: Dr Claiborne Billings (new)  HPI:  Pleasant 51 y/o AA female referred to [place] for evaluation of DOE and a history of a "60 % blockage" in 2010. She tells me she went to [facility] in 2010 for tachycardia. She had been having palpitations for some time but on the day of admission she had sustained tachycardia. She tells me she was hospitalized for 5 days. She is not sure if she had a cath or not. She signed out AMA on the 5 th day.  After she signed out [place] she tells me they sent a sheriff to her house and told her she "could die" and that she had a "60% blockage in her aorta". I was unable to locate any of these records in [facility]. After this she made lifestyle changes in her diet and started walking for exercise. She never had any cardiology follow up after this.   She recently was seen to establish care with a PCP and related a history of DOE. She was also noted to have HTN and HLD. The pt describes a history of DOE since her hysterectomy in Aug 2017. She says she now feels like she is SOB doing her usual walk. She also notes SOB and "gasping for air" when she lays flat. She tells me her boyfriend wakes her up at times because she is not breathing. She has rare palpitations, no sustained tachycardia. She denies any chest pain that sounds like angina.     Current Outpatient Prescriptions  Medication Sig Dispense Refill  . ferrous sulfate 325 (65 FE) MG tablet Take 325 mg by mouth daily with breakfast.    . hydrOXYzine (ATARAX/VISTARIL) 50 MG tablet Take 50 mg by mouth every 6 (six) hours as needed.    Marland Kitchen ibuprofen (ADVIL,MOTRIN) 600 MG tablet 1  po pc every 6 hours for 5 days then prn-pain 30 tablet 1  . lisinopril-hydrochlorothiazide (PRINZIDE,ZESTORETIC) 10-12.5 MG tablet Take 1 tablet by mouth daily. 30 tablet 5   No current facility-administered medications for this visit.     Allergies    Allergen Reactions  . Medroxyprogesterone Anxiety    Social History   Social History  . Marital status: Legally Separated    Spouse name: N/A  . Number of children: N/A  . Years of education: N/A   Occupational History  . Not on file.   Social History Main Topics  . Smoking status: Never Smoker  . Smokeless tobacco: Never Used  . Alcohol use No  . Drug use: No  . Sexual activity: Yes    Birth control/ protection: Surgical     Comment: ABD hysterectomy without ovaries   Other Topics Concern  . Not on file   Social History Narrative  . No narrative on file     Review of Systems: General: negative for chills, fever, night sweats or weight changes.  Cardiovascular: negative for chest pain, edema Dermatological: negative for rash Respiratory: negative for cough or wheezing Urologic: negative for hematuria Abdominal: negative for nausea, vomiting, diarrhea, bright red blood per rectum, melena, or hematemesis Neurologic: negative for visual changes, syncope, or dizziness Occasional transient Rt facial numbness All other systems reviewed and are otherwise negative except as noted above.    Blood pressure (!) 164/92, pulse 81, height 5\' 9"  (1.753 m), weight 229 lb 3.2 oz (104 kg), last menstrual period 07/30/2015.  General appearance: alert, cooperative, no distress  and moderately obese Neck: no carotid bruit and no JVD Lungs: clear to auscultation bilaterally Heart: regular rate and rhythm Abdomen: soft, non-tender; bowel sounds normal; no masses,  no organomegaly Extremities: extremities normal, atraumatic, no cyanosis or edema Pulses: 2+ and symmetric Skin: Skin color, texture, turgor normal. No rashes or lesions Neurologic: Grossly normal  EKG NSR, poor anterior RW  ASSESSMENT AND PLAN:   DOE (dyspnea on exertion) The main complaint that referred her to [place] is DOE since her hysterectomy in Aug and (?) hx of CAD  Uncontrolled hypertension B/P 166/92- add  Lisinopril HCTZ 10/12.5  Obesity (BMI 30.0-34.9) BMI 33, her wgt has gradually come down over the past year  Dyslipidemia LDL 145 on Lipid panel in Sept 2017  Sleep apnea Sleep apnea by history  CAD- ?  This history is not clear and I could find no records documenting CAD   PLAN  Reviewed with Dr Claiborne Billings- plan exercise echo, sleep study, and add Lisinopril 10/12.5 for HTN. She'll have a f/u BMP when she comes for her stress echo. F/U with Dr Claiborne Billings after her sleep study. I asked her to try Tylenol and stop her NSAIDs. She is already practicing a heart healthy diet and walking for exercise. Will defer f/u of her Lipids to her PCP (she was going to re check in 6 months).   Kerin Ransom PA-C 11/07/2016 8:58 AM

## 2016-11-07 NOTE — Assessment & Plan Note (Signed)
The main complaint that referred her to [place] is DOE since her hysterectomy in Aug and (?) hx of CAD

## 2016-11-07 NOTE — Assessment & Plan Note (Signed)
Sleep apnea by history

## 2016-11-13 ENCOUNTER — Other Ambulatory Visit: Payer: Self-pay

## 2016-11-13 DIAGNOSIS — R0609 Other forms of dyspnea: Principal | ICD-10-CM

## 2016-11-13 DIAGNOSIS — R0600 Dyspnea, unspecified: Secondary | ICD-10-CM

## 2016-11-13 NOTE — Addendum Note (Signed)
Addended by: Waylan Rocher on: 11/13/2016 01:46 PM   Modules accepted: Orders

## 2016-11-18 ENCOUNTER — Encounter: Payer: Self-pay | Admitting: Nurse Practitioner

## 2016-11-18 ENCOUNTER — Ambulatory Visit (INDEPENDENT_AMBULATORY_CARE_PROVIDER_SITE_OTHER): Payer: BLUE CROSS/BLUE SHIELD | Admitting: Nurse Practitioner

## 2016-11-18 VITALS — BP 138/80 | HR 68 | Temp 98.1°F | Wt 230.0 lb

## 2016-11-18 DIAGNOSIS — I10 Essential (primary) hypertension: Secondary | ICD-10-CM

## 2016-11-18 DIAGNOSIS — E669 Obesity, unspecified: Secondary | ICD-10-CM | POA: Diagnosis not present

## 2016-11-18 DIAGNOSIS — Z23 Encounter for immunization: Secondary | ICD-10-CM

## 2016-11-18 DIAGNOSIS — D500 Iron deficiency anemia secondary to blood loss (chronic): Secondary | ICD-10-CM | POA: Diagnosis not present

## 2016-11-18 DIAGNOSIS — R5383 Other fatigue: Secondary | ICD-10-CM | POA: Diagnosis not present

## 2016-11-18 DIAGNOSIS — E782 Mixed hyperlipidemia: Secondary | ICD-10-CM

## 2016-11-18 DIAGNOSIS — M791 Myalgia, unspecified site: Secondary | ICD-10-CM | POA: Insufficient documentation

## 2016-11-18 NOTE — Assessment & Plan Note (Signed)
>>  ASSESSMENT AND PLAN FOR FATIGUE WRITTEN ON 11/18/2016 10:01 AM BY Golden Emile LUM, NP  Thyroid panel and iron panel repeat today

## 2016-11-18 NOTE — Patient Instructions (Addendum)
Return to lab fasting at least 6-8hrs prior to blood draw.  Maintain upcoming appt for stress test, echocardiogram, and sleep study.  Consider diet pill if above diagnostics are normal.  DASH Eating Plan DASH stands for "Dietary Approaches to Stop Hypertension." The DASH eating plan is a healthy eating plan that has been shown to reduce high blood pressure (hypertension). It may also reduce your risk for type 2 diabetes, heart disease, and stroke. The DASH eating plan may also help with weight loss. What are tips for following this plan? General guidelines   Avoid eating more than 2,300 mg (milligrams) of salt (sodium) a day. If you have hypertension, you may need to reduce your sodium intake to 1,500 mg a day.  Limit alcohol intake to no more than 1 drink a day for nonpregnant women and 2 drinks a day for men. One drink equals 12 oz of beer, 5 oz of wine, or 1 oz of hard liquor.  Work with your health care provider to maintain a healthy body weight or to lose weight. Ask what an ideal weight is for you.  Get at least 30 minutes of exercise that causes your heart to beat faster (aerobic exercise) most days of the week. Activities may include walking, swimming, or biking.  Work with your health care provider or diet and nutrition specialist (dietitian) to adjust your eating plan to your individual calorie needs. Reading food labels   Check food labels for the amount of sodium per serving. Choose foods with less than 5 percent of the Daily Value of sodium. Generally, foods with less than 300 mg of sodium per serving fit into this eating plan.  To find whole grains, look for the word "whole" as the first word in the ingredient list. Shopping   Buy products labeled as "low-sodium" or "no salt added."  Buy fresh foods. Avoid canned foods and premade or frozen meals. Cooking   Avoid adding salt when cooking. Use salt-free seasonings or herbs instead of table salt or sea salt. Check with  your health care provider or pharmacist before using salt substitutes.  Do not fry foods. Cook foods using healthy methods such as baking, boiling, grilling, and broiling instead.  Cook with heart-healthy oils, such as olive, canola, soybean, or sunflower oil. Meal planning    Eat a balanced diet that includes:  5 or more servings of fruits and vegetables each day. At each meal, try to fill half of your plate with fruits and vegetables.  Up to 6-8 servings of whole grains each day.  Less than 6 oz of lean meat, poultry, or fish each day. A 3-oz serving of meat is about the same size as a deck of cards. One egg equals 1 oz.  2 servings of low-fat dairy each day.  A serving of nuts, seeds, or beans 5 times each week.  Heart-healthy fats. Healthy fats called Omega-3 fatty acids are found in foods such as flaxseeds and coldwater fish, like sardines, salmon, and mackerel.  Limit how much you eat of the following:  Canned or prepackaged foods.  Food that is high in trans fat, such as fried foods.  Food that is high in saturated fat, such as fatty meat.  Sweets, desserts, sugary drinks, and other foods with added sugar.  Full-fat dairy products.  Do not salt foods before eating.  Try to eat at least 2 vegetarian meals each week.  Eat more home-cooked food and less restaurant, buffet, and fast food.  When  eating at a restaurant, ask that your food be prepared with less salt or no salt, if possible. What foods are recommended? The items listed may not be a complete list. Talk with your dietitian about what dietary choices are best for you. Grains  Whole-grain or whole-wheat bread. Whole-grain or whole-wheat pasta. Brown rice. Modena Morrow. Bulgur. Whole-grain and low-sodium cereals. Pita bread. Low-fat, low-sodium crackers. Whole-wheat flour tortillas. Vegetables  Fresh or frozen vegetables (raw, steamed, roasted, or grilled). Low-sodium or reduced-sodium tomato and vegetable  juice. Low-sodium or reduced-sodium tomato sauce and tomato paste. Low-sodium or reduced-sodium canned vegetables. Fruits  All fresh, dried, or frozen fruit. Canned fruit in natural juice (without added sugar). Meat and other protein foods  Skinless chicken or [place]. Ground chicken or [place]. Pork with fat trimmed off. Fish and seafood. Egg whites. Dried beans, peas, or lentils. Unsalted nuts, nut butters, and seeds. Unsalted canned beans. Lean cuts of beef with fat trimmed off. Low-sodium, lean deli meat. Dairy  Low-fat (1%) or fat-free (skim) milk. Fat-free, low-fat, or reduced-fat cheeses. Nonfat, low-sodium ricotta or cottage cheese. Low-fat or nonfat yogurt. Low-fat, low-sodium cheese. Fats and oils  Soft margarine without trans fats. Vegetable oil. Low-fat, reduced-fat, or light mayonnaise and salad dressings (reduced-sodium). Canola, safflower, olive, soybean, and sunflower oils. Avocado. Seasoning and other foods  Herbs. Spices. Seasoning mixes without salt. Unsalted popcorn and pretzels. Fat-free sweets. What foods are not recommended? The items listed may not be a complete list. Talk with your dietitian about what dietary choices are best for you. Grains  Baked goods made with fat, such as croissants, muffins, or some breads. Dry pasta or rice meal packs. Vegetables  Creamed or fried vegetables. Vegetables in a cheese sauce. Regular canned vegetables (not low-sodium or reduced-sodium). Regular canned tomato sauce and paste (not low-sodium or reduced-sodium). Regular tomato and vegetable juice (not low-sodium or reduced-sodium). Angie Fava. Olives. Fruits  Canned fruit in a light or heavy syrup. Fried fruit. Fruit in cream or butter sauce. Meat and other protein foods  Fatty cuts of meat. Ribs. Fried meat. Berniece Salines. Sausage. Bologna and other processed lunch meats. Salami. Fatback. Hotdogs. Bratwurst. Salted nuts and seeds. Canned beans with added salt. Canned or smoked fish. Whole eggs or  egg yolks. Chicken or [place] with skin. Dairy  Whole or 2% milk, cream, and half-and-half. Whole or full-fat cream cheese. Whole-fat or sweetened yogurt. Full-fat cheese. Nondairy creamers. Whipped toppings. Processed cheese and cheese spreads. Fats and oils  Butter. Stick margarine. Lard. Shortening. Ghee. Bacon fat. Tropical oils, such as coconut, palm kernel, or palm oil. Seasoning and other foods  Salted popcorn and pretzels. Onion salt, garlic salt, seasoned salt, table salt, and sea salt. Worcestershire sauce. Tartar sauce. Barbecue sauce. Teriyaki sauce. Soy sauce, including reduced-sodium. Steak sauce. Canned and packaged gravies. Fish sauce. Oyster sauce. Cocktail sauce. Horseradish that you find on the shelf. Ketchup. Mustard. Meat flavorings and tenderizers. Bouillon cubes. Hot sauce and Tabasco sauce. Premade or packaged marinades. Premade or packaged taco seasonings. Relishes. Regular salad dressings. Where to find more information:  National Heart, Lung, and [place]: https://wilson-eaton.com/  American Heart Association: www.heart.org Summary  The DASH eating plan is a healthy eating plan that has been shown to reduce high blood pressure (hypertension). It may also reduce your risk for type 2 diabetes, heart disease, and stroke.  With the DASH eating plan, you should limit salt (sodium) intake to 2,300 mg a day. If you have hypertension, you may need to reduce your sodium  intake to 1,500 mg a day.  When on the DASH eating plan, aim to eat more fresh fruits and vegetables, whole grains, lean proteins, low-fat dairy, and heart-healthy fats.  Work with your health care provider or diet and nutrition specialist (dietitian) to adjust your eating plan to your individual calorie needs. This information is not intended to replace advice given to you by your health care provider. Make sure you discuss any questions you have with your health care provider. Document Released: 08/22/2011  Document Revised: 08/26/2016 Document Reviewed: 08/26/2016 Elsevier Interactive Patient Education  2017 [employer].

## 2016-11-18 NOTE — Assessment & Plan Note (Signed)
>>  ASSESSMENT AND PLAN FOR OBESITY (BMI 30.0-34.9) WRITTEN ON 11/18/2016 10:03 AM BY Kendon Sedeno LUM, NP  Consider use of phentermine or contrave if normal stress test, echocardiogram, and sleep study. Consider DASH diet and portion control

## 2016-11-18 NOTE — Assessment & Plan Note (Signed)
Consider use of phentermine or contrave if normal stress test, echocardiogram, and sleep study. Consider DASH diet and portion control

## 2016-11-18 NOTE — Assessment & Plan Note (Signed)
Thyroid panel and iron panel repeat today

## 2016-11-18 NOTE — Progress Notes (Signed)
Subjective:  Patient ID: Kimberly Snow, female    DOB: 1966/07/19  Age: 51 y.o. MRN: [id number]  CC: Hypertension   HPI  HTN: Started on lisinopril-HCTZ by cardiology. Does not check BP at home. Has the following tests scheduled: stress test 12/05/16. Echocardiogram 11/28/16. Sleep apnea in April 2018. No cough, no hoarseness, no edema, no palpitations.  Anemia: Unable to take ferrous sulfate as prescribed due to nausea. Has persistent fatigue ans SOB with exertion.  Obesity: Has changed diet to heart healthy diet and walking 64mins every day. She is concerned about persistent weight gain despite life style modifications.   Outpatient Medications Prior to Visit  Medication Sig Dispense Refill  . ferrous sulfate 325 (65 FE) MG tablet Take 325 mg by mouth daily with breakfast.    . hydrOXYzine (ATARAX/VISTARIL) 50 MG tablet Take 50 mg by mouth every 6 (six) hours as needed.    Marland Kitchen lisinopril-hydrochlorothiazide (PRINZIDE,ZESTORETIC) 10-12.5 MG tablet Take 1 tablet by mouth daily. 30 tablet 5  . ibuprofen (ADVIL,MOTRIN) 600 MG tablet 1  po pc every 6 hours for 5 days then prn-pain 30 tablet 1   No facility-administered medications prior to visit.     ROS See HPI  Objective:  BP 138/80   Pulse 68   Temp 98.1 F (36.7 C)   Wt 230 lb (104.3 kg)   LMP 07/30/2015   SpO2 98%   BMI 33.97 kg/m   BP Readings from Last 3 Encounters:  11/18/16 138/80  11/07/16 (!) 164/92  06/07/16 146/83    Wt Readings from Last 3 Encounters:  11/18/16 230 lb (104.3 kg)  11/07/16 229 lb 3.2 oz (104 kg)  06/07/16 215 lb 1.9 oz (97.6 kg)    Physical Exam  Constitutional: She is oriented to person, place, and time. No distress.  Neck: Normal range of motion. Neck supple. No thyromegaly present.  Cardiovascular: Normal rate, regular rhythm and normal heart sounds.   Pulmonary/Chest: Effort normal and breath sounds normal. No respiratory distress.  Abdominal: Soft. Bowel sounds are normal.  She exhibits no distension.  Musculoskeletal: Normal range of motion. She exhibits no edema.  Neurological: She is alert and oriented to person, place, and time.  Skin: Skin is warm and dry.  Psychiatric: She has a normal mood and affect. Her behavior is normal.    Lab Results  Component Value Date   WBC 11.3 (H) 05/22/2016   HGB 9.4 (L) 05/22/2016   HCT 29.4 (L) 05/22/2016   PLT 414.0 (H) 05/22/2016   GLUCOSE 95 05/22/2016   CHOL 221 (H) 05/22/2016   TRIG 166.0 (H) 05/22/2016   HDL 43.40 05/22/2016   LDLCALC 145 (H) 05/22/2016   ALT 16 05/22/2016   AST 20 05/22/2016   NA 138 05/22/2016   K 3.5 05/22/2016   CL 102 05/22/2016   CREATININE 0.74 05/22/2016   BUN 11 05/22/2016   CO2 31 05/22/2016   TSH 1.37 05/22/2016   HGBA1C 5.3 05/22/2016    No results found.  Assessment & Plan:   Tay was seen today for hypertension.  Diagnoses and all orders for this visit:  Essential hypertension -     Basic metabolic panel; Future  Iron deficiency anemia due to chronic blood loss -     CBC; Future -     IBC panel; Future -     Ferritin; Future  Fatigue, unspecified type -     CBC; Future -     Thyroid Panel With TSH; Future  Obesity (BMI 30.0-34.9) -     CBC; Future -     Thyroid Panel With TSH; Future -     Basic metabolic panel; Future  Mixed hyperlipidemia -     Lipid panel; Future  Need for diphtheria-tetanus-pertussis (Tdap) vaccine -     Tdap vaccine greater than or equal to 7yo IM  Encounter for immunization -     Flu Vaccine QUAD 36+ mos IM   I have discontinued Ms. McKinzie's ibuprofen. I am also having her maintain her ferrous sulfate, hydrOXYzine, and lisinopril-hydrochlorothiazide.  No orders of the defined types were placed in this encounter.   Follow-up: Return in about 3 months (around 02/18/2017) for HTN, weight loss management.Wilfred Lacy, NP

## 2016-11-27 ENCOUNTER — Other Ambulatory Visit (HOSPITAL_COMMUNITY): Payer: BLUE CROSS/BLUE SHIELD

## 2016-11-28 ENCOUNTER — Other Ambulatory Visit: Payer: Self-pay

## 2016-11-28 ENCOUNTER — Ambulatory Visit (HOSPITAL_COMMUNITY): Payer: BLUE CROSS/BLUE SHIELD | Attending: Cardiovascular Disease

## 2016-11-28 DIAGNOSIS — R0600 Dyspnea, unspecified: Secondary | ICD-10-CM | POA: Diagnosis present

## 2016-11-28 DIAGNOSIS — I10 Essential (primary) hypertension: Secondary | ICD-10-CM | POA: Diagnosis not present

## 2016-11-28 DIAGNOSIS — E785 Hyperlipidemia, unspecified: Secondary | ICD-10-CM | POA: Diagnosis not present

## 2016-11-28 DIAGNOSIS — I2510 Atherosclerotic heart disease of native coronary artery without angina pectoris: Secondary | ICD-10-CM | POA: Diagnosis not present

## 2016-11-28 DIAGNOSIS — D649 Anemia, unspecified: Secondary | ICD-10-CM | POA: Insufficient documentation

## 2016-11-28 DIAGNOSIS — G4730 Sleep apnea, unspecified: Secondary | ICD-10-CM | POA: Insufficient documentation

## 2016-12-04 ENCOUNTER — Telehealth (HOSPITAL_COMMUNITY): Payer: Self-pay

## 2016-12-04 NOTE — Telephone Encounter (Signed)
Encounter complete. 

## 2016-12-05 ENCOUNTER — Ambulatory Visit (HOSPITAL_COMMUNITY)
Admission: RE | Admit: 2016-12-05 | Discharge: 2016-12-05 | Disposition: A | Discharge status: Home / Self Care | Payer: BLUE CROSS/BLUE SHIELD | Source: Ambulatory Visit | Attending: Cardiology | Admitting: Cardiology

## 2016-12-05 DIAGNOSIS — I2510 Atherosclerotic heart disease of native coronary artery without angina pectoris: Secondary | ICD-10-CM | POA: Diagnosis present

## 2016-12-05 LAB — EXERCISE TOLERANCE TEST
Estimated workload: 10.4 METS
Exercise duration (min): 9 min
Exercise duration (sec): 0 s
MPHR: 170 {beats}/min
Peak HR: 153 {beats}/min
Percent HR: 90 %
RPE: 17
Rest HR: 90 {beats}/min

## 2016-12-10 ENCOUNTER — Telehealth: Payer: Self-pay | Admitting: *Deleted

## 2016-12-10 NOTE — Telephone Encounter (Signed)
Left msg w results and request to give office a call regarding follow up bloodwork.

## 2016-12-10 NOTE — Telephone Encounter (Signed)
-----   Message from Erlene Quan, [place] sent at 12/10/2016  8:10 AM EDT ----- Please let the pt know her stress looked OK- some evidence of HTN changes so B/P control will need to be a priority. She was supposed to have a f/u BMP after recent medications changes-can you check and make sure dhe knows this  Kerin Ransom PA-C 12/10/2016 8:09 AM

## 2016-12-13 NOTE — Telephone Encounter (Signed)
Spoke to patient regarding test results. She verbalized understanding.   Also advised on need for labwork recheck. Will send lab slips in mail for her to get drawn.  Notes concern now that she believes her BP is running too low on the lisinopril-HCTZ. She doesn't have readings in front of her, but informed me she would call back today or next week to provide these so that we can advise on dose adjustment to her med if needed.

## 2016-12-26 ENCOUNTER — Encounter (HOSPITAL_BASED_OUTPATIENT_CLINIC_OR_DEPARTMENT_OTHER): Payer: BLUE CROSS/BLUE SHIELD

## 2016-12-26 ENCOUNTER — Telehealth: Payer: Self-pay | Admitting: *Deleted

## 2016-12-26 NOTE — Telephone Encounter (Signed)
Telephoned patient to inform her that insurance company has denied a in lab sleep study. She will need to have a home study. She states that she has been trying to call us to cancel the appointment. States that her symptoms has gone away. She has also since moved to [employer]. I called [facility] and canceled the sleep study scheduled for tonight.

## 2017-01-06 ENCOUNTER — Ambulatory Visit: Payer: BLUE CROSS/BLUE SHIELD | Admitting: Cardiovascular Disease

## 2017-06-04 IMAGING — US US TRANSVAGINAL NON-OB
1 series · 15 of 25 positions shown · non-contrast
Comparison: None

CLINICAL DATA: Lower abdominal and pelvic pain. Right lower
quadrant mass. Abnormal uterine bleeding. Fibroids. LMP 07/30/2015.

EXAM:
TRANSABDOMINAL AND TRANSVAGINAL ULTRASOUND OF PELVIS
TECHNIQUE: Both transabdominal and transvaginal ultrasound examinations of the
pelvis were performed. Transabdominal technique was performed for
global imaging of the pelvis including uterus, ovaries, adnexal
regions, and pelvic cul-de-sac. It was necessary to proceed with
endovaginal exam following the transabdominal exam to visualize the
endometrial stripe and ovaries.

[Series 1: us transvaginal non-ob · 15 of 108 slices shown]
[im 1/108]
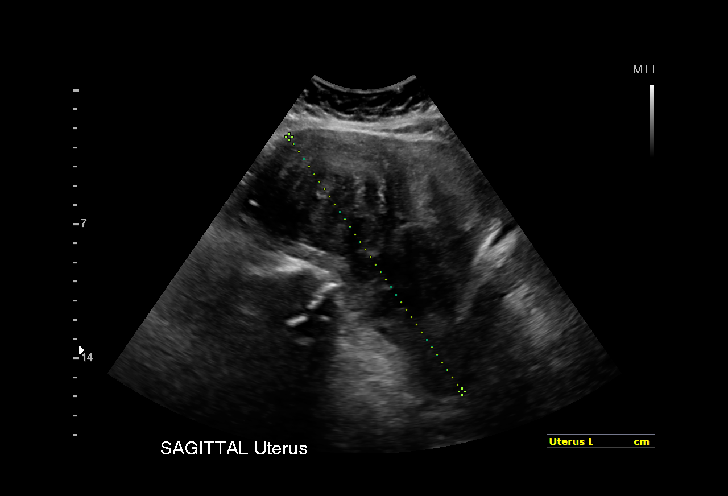
[im 9/108]
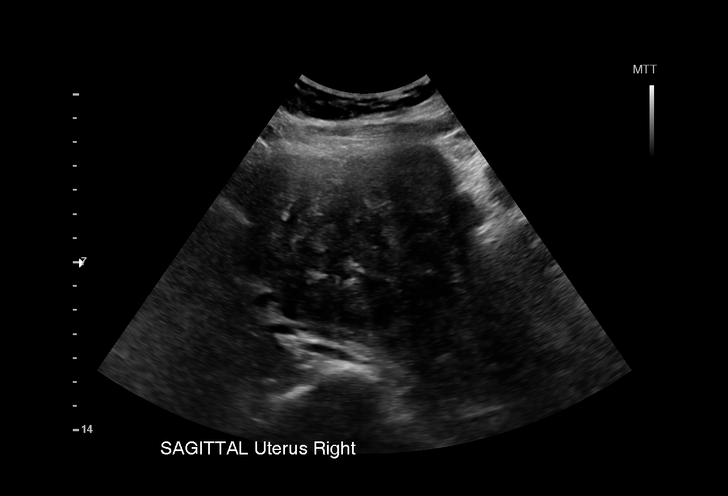
[im 18/108]
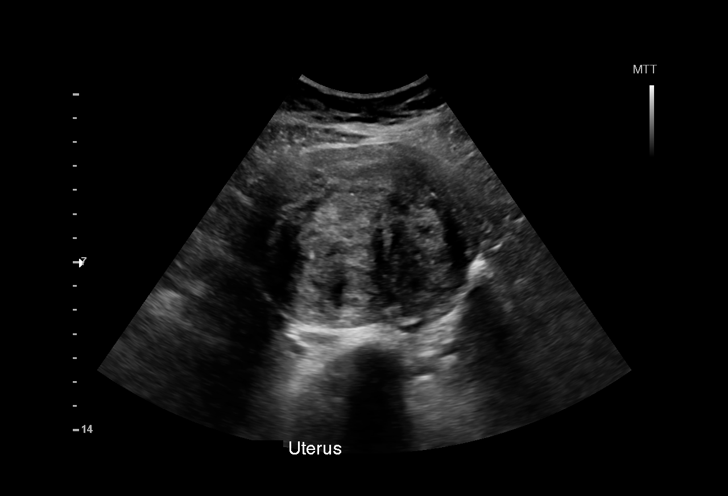
[im 23/108]
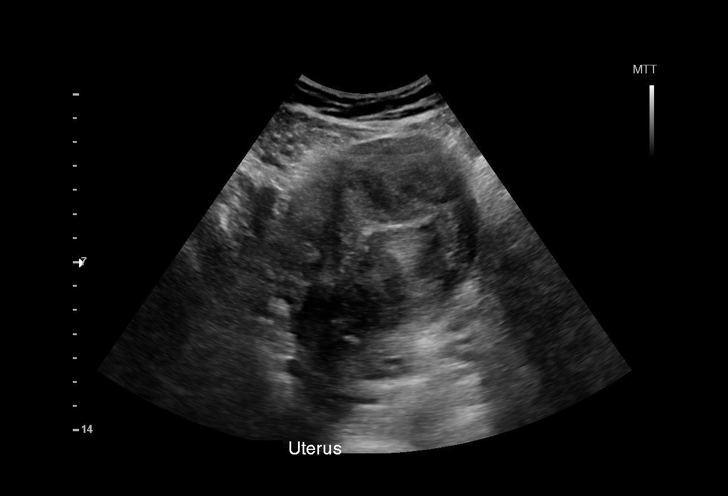
[im 32/108]
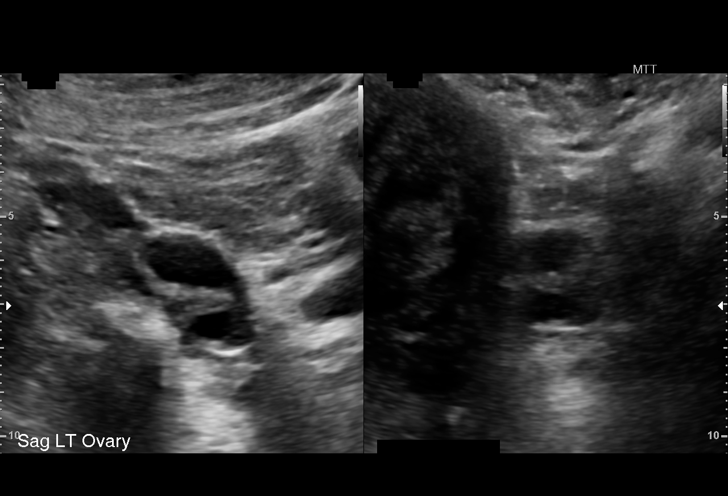
[im 41/108]
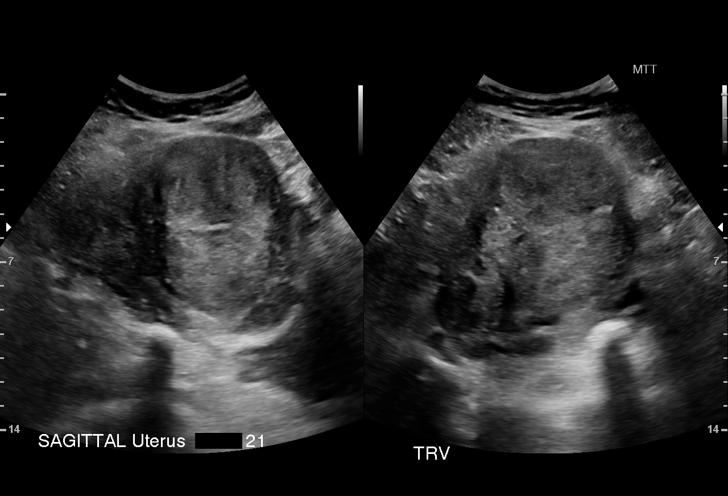
[im 45/108]
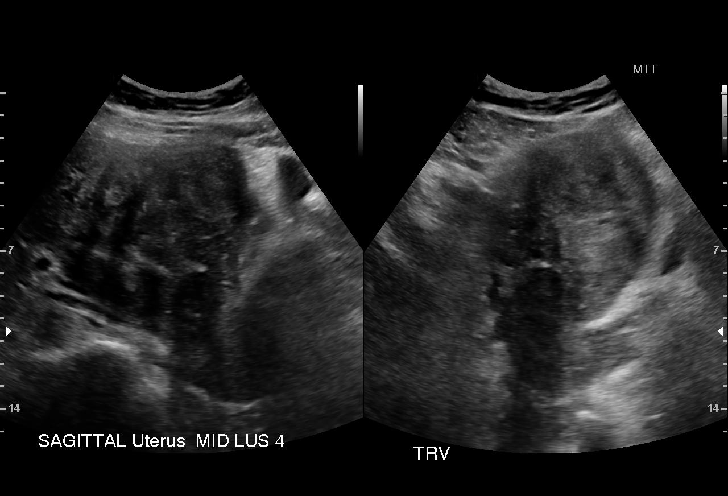
[im 54/108]
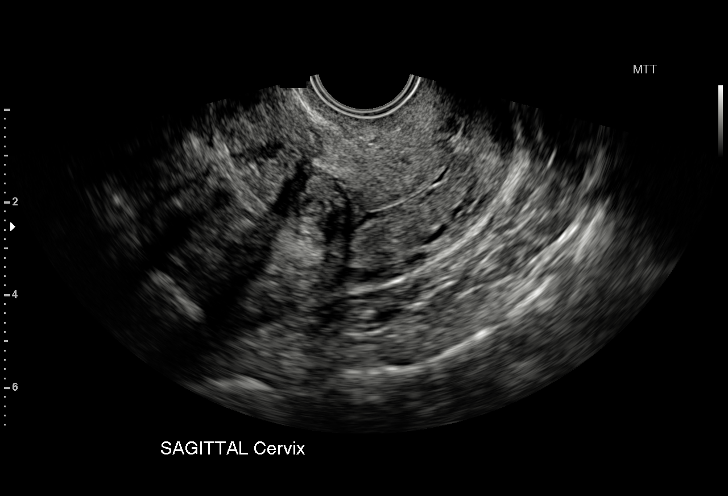
[im 63/108]
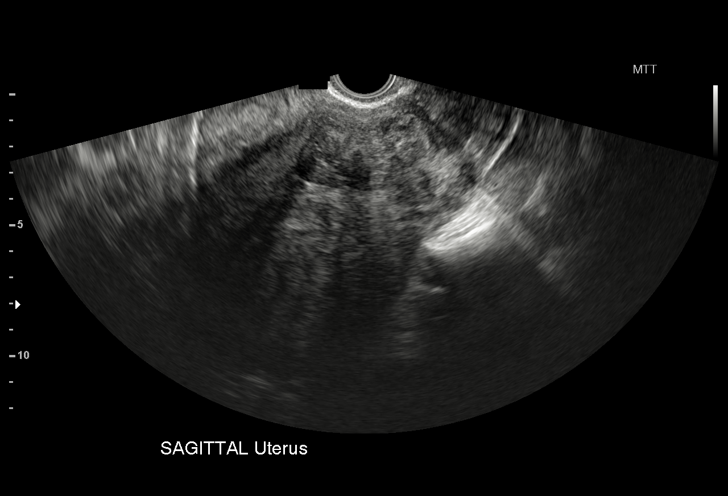
[im 67/108]
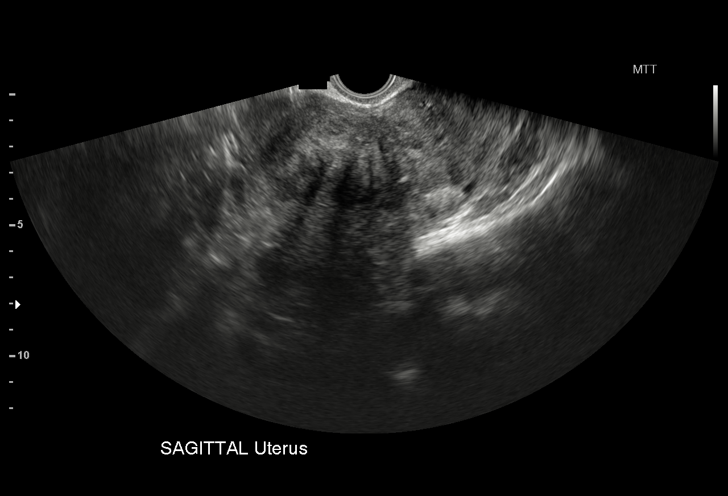
[im 76/108]
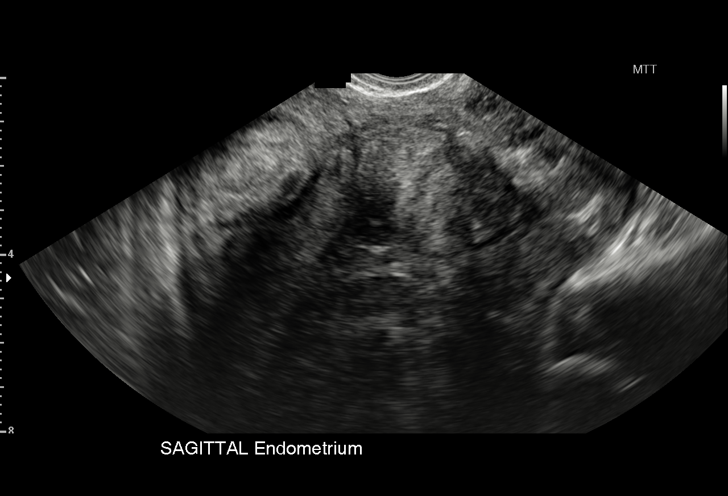
[im 85/108]
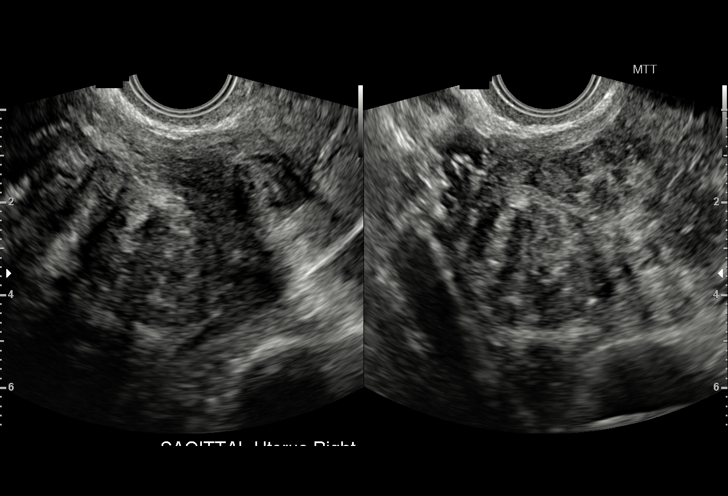
[im 90/108]
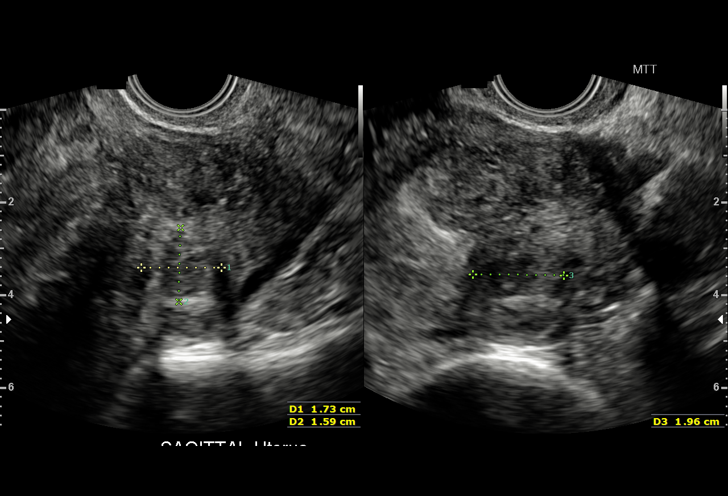
[im 99/108]
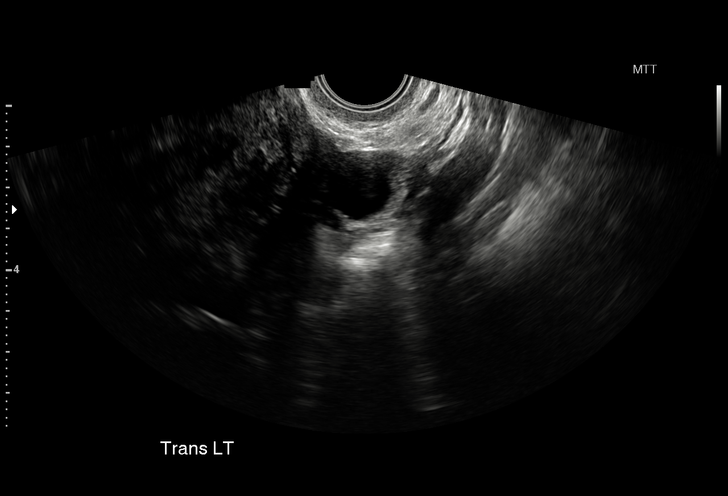
[im 108/108]
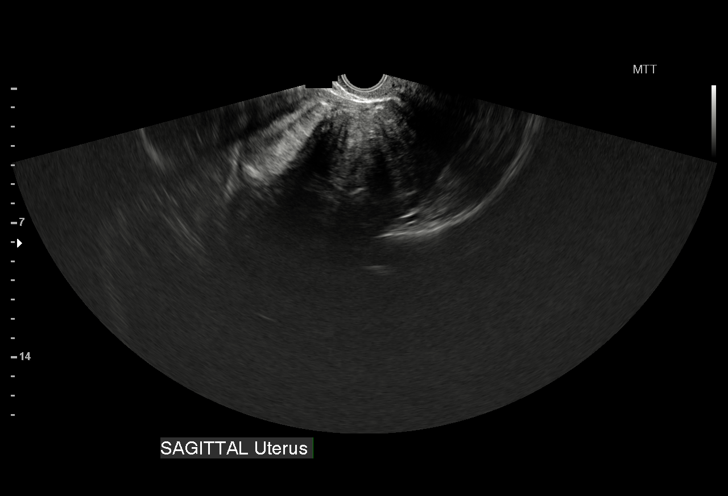

[15 of 25 positions shown; findings below may reference images not displayed]

FINDINGS: Uterus

Measurements: 16.1 x 9.0 x 10.2 cm. Multiple fibroids are seen
involving the uterus diffusely. At least 5 distinct fibroids are
seen ranging in size from 1.7 cm to 8.0 cm. The largest 8 cm fibroid
is subserosal in location in the right uterine fundus. Several
fibroids have submucosal components indenting the endometrium.

Endometrium

Thickness: 8 mm. Several submucosal fibroids seen indenting the
endometrial stripe.

Right ovary

Measurements: 3.5 x 2.0 x 3.1 cm. Normal appearance/no adnexal mass.

Left ovary

Measurements: 3.6 x 1.8 x 2.9 cm. Normal appearance/no adnexal mass.

Other findings

No free fluid.
IMPRESSION: Enlarged uterus with multiple fibroids, largest measuring 8 cm.

Normal appearance of both ovaries.  No adnexal mass identified.

## 2017-08-05 ENCOUNTER — Ambulatory Visit: Payer: BLUE CROSS/BLUE SHIELD | Admitting: Nurse Practitioner

## 2017-08-05 ENCOUNTER — Ambulatory Visit: Payer: Self-pay

## 2017-08-05 ENCOUNTER — Encounter: Payer: Self-pay | Admitting: Nurse Practitioner

## 2017-08-05 VITALS — BP 156/82 | HR 91 | Temp 98.2°F | Ht 69.0 in | Wt 231.0 lb

## 2017-08-05 DIAGNOSIS — R6889 Other general symptoms and signs: Secondary | ICD-10-CM | POA: Diagnosis not present

## 2017-08-05 DIAGNOSIS — F419 Anxiety disorder, unspecified: Secondary | ICD-10-CM | POA: Insufficient documentation

## 2017-08-05 DIAGNOSIS — R5383 Other fatigue: Secondary | ICD-10-CM | POA: Diagnosis not present

## 2017-08-05 DIAGNOSIS — D500 Iron deficiency anemia secondary to blood loss (chronic): Secondary | ICD-10-CM

## 2017-08-05 DIAGNOSIS — E782 Mixed hyperlipidemia: Secondary | ICD-10-CM

## 2017-08-05 DIAGNOSIS — E785 Hyperlipidemia, unspecified: Secondary | ICD-10-CM | POA: Diagnosis not present

## 2017-08-05 DIAGNOSIS — F329 Major depressive disorder, single episode, unspecified: Secondary | ICD-10-CM | POA: Diagnosis not present

## 2017-08-05 DIAGNOSIS — I10 Essential (primary) hypertension: Secondary | ICD-10-CM

## 2017-08-05 DIAGNOSIS — F32A Depression, unspecified: Secondary | ICD-10-CM

## 2017-08-05 LAB — BASIC METABOLIC PANEL WITH GFR
BUN: 11 mg/dL (ref 6–23)
CO2: 30 meq/L (ref 19–32)
Calcium: 9.3 mg/dL (ref 8.4–10.5)
Chloride: 102 meq/L (ref 96–112)
Creatinine, Ser: 0.72 mg/dL (ref 0.40–1.20)
GFR: 109.86 mL/min
Glucose, Bld: 104 mg/dL — ABNORMAL HIGH (ref 70–99)
Potassium: 3.6 meq/L (ref 3.5–5.1)
Sodium: 137 meq/L (ref 135–145)

## 2017-08-05 LAB — TSH: TSH: 1.21 u[IU]/mL (ref 0.35–4.50)

## 2017-08-05 LAB — CBC
HEMATOCRIT: 38.6 % (ref 36.0–46.0)
Hemoglobin: 12.4 g/dL (ref 12.0–15.0)
MCHC: 32.1 g/dL (ref 30.0–36.0)
MCV: 85 fl (ref 78.0–100.0)
PLATELETS: 298 10*3/uL (ref 150.0–400.0)
RBC: 4.54 Mil/uL (ref 3.87–5.11)
RDW: 14.3 % (ref 11.5–15.5)
WBC: 7.6 10*3/uL (ref 4.0–10.5)

## 2017-08-05 LAB — LIPID PANEL
Cholesterol: 215 mg/dL — ABNORMAL HIGH (ref 0–200)
HDL: 41.4 mg/dL (ref >39.00)
LDL Cholesterol: 154 mg/dL — ABNORMAL HIGH (ref 0–99)
NONHDL: 173.9
Total CHOL/HDL Ratio: 5
Triglycerides: 101 mg/dL (ref 0.0–149.0)
VLDL: 20.2 mg/dL (ref 0.0–40.0)

## 2017-08-05 LAB — HEPATIC FUNCTION PANEL
ALT: 12 U/L (ref 0–35)
AST: 13 U/L (ref 0–37)
Albumin: 4 g/dL (ref 3.5–5.2)
Alkaline Phosphatase: 57 U/L (ref 39–117)
Bilirubin, Direct: 0.1 mg/dL (ref 0.0–0.3)
Total Bilirubin: 0.4 mg/dL (ref 0.2–1.2)
Total Protein: 7.1 g/dL (ref 6.0–8.3)

## 2017-08-05 LAB — IBC PANEL
IRON: 42 ug/dL (ref 42–145)
Saturation Ratios: 9.8 % — ABNORMAL LOW (ref 20.0–50.0)
Transferrin: 306 mg/dL (ref 212.0–360.0)

## 2017-08-05 LAB — C-REACTIVE PROTEIN: CRP: 0.6 mg/dL (ref 0.5–20.0)

## 2017-08-05 NOTE — Progress Notes (Signed)
Subjective:  Patient ID: Kimberly Snow, female    DOB: 12-29-1965  Age: 51 y.o. MRN: [id number]  CC: Tingling (tingling sensation on legs,arm. 3 wks ago/ BP med consult: side effect)   Depression       The patient presents with depression.  This is a new problem.  The current episode started more than 1 month ago.   The onset quality is sudden.   The problem has been gradually improving since onset.  Associated symptoms include fatigue, insomnia, irritable, restlessness, decreased interest, appetite change, body aches, myalgias and headaches.  Associated symptoms include no decreased concentration, no helplessness, no hopelessness, no indigestion, not sad and no suicidal ideas.     The symptoms are aggravated by work stress (lost her job. states she thoguh she was getting a promotion, but was fired instead).  Past treatments include nothing.  Past medical history includes depression.    Tingling of LE and forearms x 3weeks, constant Generalized bodyaches. No joint pain. No weakness, no fever, no joint swelling. Joint stiffness (all day). Onset after job loss 51month ago.  HTN: Has not been taking BP medication as prescribed due to fear of potential side effects. Reports home BP reading 150s/80s. BP Readings from Last 3 Encounters:  08/05/17 (!) 156/82  11/18/16 138/80  11/07/16 (!) 164/92    Outpatient Medications Prior to Visit  Medication Sig Dispense Refill  . ferrous sulfate 325 (65 FE) MG tablet Take 325 mg by mouth daily with breakfast.    . lisinopril-hydrochlorothiazide (PRINZIDE,ZESTORETIC) 10-12.5 MG tablet Take 1 tablet by mouth daily. 30 tablet 5  . hydrOXYzine (ATARAX/VISTARIL) 50 MG tablet Take 50 mg by mouth every 6 (six) hours as needed.     No facility-administered medications prior to visit.     ROS See HPI  Objective:  BP (!) 156/82   Pulse 91   Temp 98.2 F (36.8 C)   Ht 5\' 9"  (1.753 m)   Wt 231 lb (104.8 kg)   LMP 07/30/2015   SpO2 98%   BMI 34.11  kg/m   BP Readings from Last 3 Encounters:  08/05/17 (!) 156/82  11/18/16 138/80  11/07/16 (!) 164/92    Wt Readings from Last 3 Encounters:  08/05/17 231 lb (104.8 kg)  11/18/16 230 lb (104.3 kg)  11/07/16 229 lb 3.2 oz (104 kg)    Physical Exam  Constitutional: She is oriented to person, place, and time. She is irritable. No distress.  Neck: Normal range of motion. Neck supple. No thyromegaly present.  Cardiovascular: Normal rate and regular rhythm.  Pulmonary/Chest: Effort normal and breath sounds normal.  Musculoskeletal: She exhibits no edema.  Neurological: She is alert and oriented to person, place, and time.  Normal foot exam: normal microfilament and distal pulses. Normal skin and hair distribution  Skin: Skin is warm and dry. No erythema.  Psychiatric: She has a normal mood and affect. Her behavior is normal.  Vitals reviewed.   Lab Results  Component Value Date   WBC 7.6 08/05/2017   HGB 12.4 08/05/2017   HCT 38.6 08/05/2017   PLT 298.0 08/05/2017   GLUCOSE 104 (H) 08/05/2017   CHOL 215 (H) 08/05/2017   TRIG 101.0 08/05/2017   HDL 41.40 08/05/2017   LDLCALC 154 (H) 08/05/2017   ALT 12 08/05/2017   AST 13 08/05/2017   NA 137 08/05/2017   K 3.6 08/05/2017   CL 102 08/05/2017   CREATININE 0.72 08/05/2017   BUN 11 08/05/2017   CO2 30  08/05/2017   TSH 1.21 08/05/2017   HGBA1C 5.3 05/22/2016    Assessment & Plan:   Kimberly Snow was seen today for tingling.  Diagnoses and all orders for this visit:  Essential hypertension -     Basic metabolic panel -     lisinopril-hydrochlorothiazide (PRINZIDE,ZESTORETIC) 10-12.5 MG tablet; Take 1 tablet by mouth daily.  Dyslipidemia -     Hepatic function panel -     Lipid panel  Fatigue due to depression -     C-reactive protein -     Antinuclear Antib (ANA) -     TSH  Mixed hyperlipidemia  Somatic complaints, multiple -     C-reactive protein -     Antinuclear Antib (ANA)  Iron deficiency anemia due to  chronic blood loss -     CBC -     IBC panel   I am having Kimberly Snow maintain her ferrous sulfate, hydrOXYzine, and lisinopril-hydrochlorothiazide.  Meds ordered this encounter  Medications  . lisinopril-hydrochlorothiazide (PRINZIDE,ZESTORETIC) 10-12.5 MG tablet    Sig: Take 1 tablet by mouth daily.    Dispense:  90 tablet    Refill:  1    Order Specific Question:   Supervising Provider    Answer:   Lucille Passy [[id number]]    Follow-up: Return in about 4 weeks (around 09/02/2017) for HTN, weight loss, and depression.  Wilfred Lacy, NP

## 2017-08-05 NOTE — Patient Instructions (Addendum)
stable lab results with improved anemia. Need to continue iron supplements. Lipid panel indicates persistent elevation in cholesterol and LDL. Encourage heart healthy diet and regular exercise. Pending ANA result.  Encourage regular exercise (30-34mind a day).  Consider use of phentermine for weight loss if BP improves.  Consider referral to psychology and prozac or buspar use if no improvement in mood.  DASH Eating Plan DASH stands for "Dietary Approaches to Stop Hypertension." The DASH eating plan is a healthy eating plan that has been shown to reduce high blood pressure (hypertension). It may also reduce your risk for type 2 diabetes, heart disease, and stroke. The DASH eating plan may also help with weight loss. What are tips for following this plan? General guidelines  Avoid eating more than 2,300 mg (milligrams) of salt (sodium) a day. If you have hypertension, you may need to reduce your sodium intake to 1,500 mg a day.  Limit alcohol intake to no more than 1 drink a day for nonpregnant women and 2 drinks a day for men. One drink equals 12 oz of beer, 5 oz of wine, or 1 oz of hard liquor.  Work with your health care provider to maintain a healthy body weight or to lose weight. Ask what an ideal weight is for you.  Get at least 30 minutes of exercise that causes your heart to beat faster (aerobic exercise) most days of the week. Activities may include walking, swimming, or biking.  Work with your health care provider or diet and nutrition specialist (dietitian) to adjust your eating plan to your individual calorie needs. Reading food labels  Check food labels for the amount of sodium per serving. Choose foods with less than 5 percent of the Daily Value of sodium. Generally, foods with less than 300 mg of sodium per serving fit into this eating plan.  To find whole grains, look for the word "whole" as the first word in the ingredient list. Shopping  Buy products labeled as  "low-sodium" or "no salt added."  Buy fresh foods. Avoid canned foods and premade or frozen meals. Cooking  Avoid adding salt when cooking. Use salt-free seasonings or herbs instead of table salt or sea salt. Check with your health care provider or pharmacist before using salt substitutes.  Do not fry foods. Cook foods using healthy methods such as baking, boiling, grilling, and broiling instead.  Cook with heart-healthy oils, such as olive, canola, soybean, or sunflower oil. Meal planning   Eat a balanced diet that includes: ? 5 or more servings of fruits and vegetables each day. At each meal, try to fill half of your plate with fruits and vegetables. ? Up to 6-8 servings of whole grains each day. ? Less than 6 oz of lean meat, poultry, or fish each day. A 3-oz serving of meat is about the same size as a deck of cards. One egg equals 1 oz. ? 2 servings of low-fat dairy each day. ? A serving of nuts, seeds, or beans 5 times each week. ? Heart-healthy fats. Healthy fats called Omega-3 fatty acids are found in foods such as flaxseeds and coldwater fish, like sardines, salmon, and mackerel.  Limit how much you eat of the following: ? Canned or prepackaged foods. ? Food that is high in trans fat, such as fried foods. ? Food that is high in saturated fat, such as fatty meat. ? Sweets, desserts, sugary drinks, and other foods with added sugar. ? Full-fat dairy products.  Do not salt foods  before eating.  Try to eat at least 2 vegetarian meals each week.  Eat more home-cooked food and less restaurant, buffet, and fast food.  When eating at a restaurant, ask that your food be prepared with less salt or no salt, if possible. What foods are recommended? The items listed may not be a complete list. Talk with your dietitian about what dietary choices are best for you. Grains Whole-grain or whole-wheat bread. Whole-grain or whole-wheat pasta. Brown rice. [place] Morrow. Bulgur. Whole-grain  and low-sodium cereals. Pita bread. Low-fat, low-sodium crackers. Whole-wheat flour tortillas. Vegetables Fresh or frozen vegetables (raw, steamed, roasted, or grilled). Low-sodium or reduced-sodium tomato and vegetable juice. Low-sodium or reduced-sodium tomato sauce and tomato paste. Low-sodium or reduced-sodium canned vegetables. Fruits All fresh, dried, or frozen fruit. Canned fruit in natural juice (without added sugar). Meat and other protein foods Skinless chicken or [place]. Ground chicken or [place]. Pork with fat trimmed off. Fish and seafood. Egg whites. Dried beans, peas, or lentils. Unsalted nuts, nut butters, and seeds. Unsalted canned beans. Lean cuts of beef with fat trimmed off. Low-sodium, lean deli meat. Dairy Low-fat (1%) or fat-free (skim) milk. Fat-free, low-fat, or reduced-fat cheeses. Nonfat, low-sodium ricotta or cottage cheese. Low-fat or nonfat yogurt. Low-fat, low-sodium cheese. Fats and oils Soft margarine without trans fats. Vegetable oil. Low-fat, reduced-fat, or light mayonnaise and salad dressings (reduced-sodium). Canola, safflower, olive, soybean, and sunflower oils. Avocado. Seasoning and other foods Herbs. Spices. Seasoning mixes without salt. Unsalted popcorn and pretzels. Fat-free sweets. What foods are not recommended? The items listed may not be a complete list. Talk with your dietitian about what dietary choices are best for you. Grains Baked goods made with fat, such as croissants, muffins, or some breads. Dry pasta or rice meal packs. Vegetables Creamed or fried vegetables. Vegetables in a cheese sauce. Regular canned vegetables (not low-sodium or reduced-sodium). Regular canned tomato sauce and paste (not low-sodium or reduced-sodium). Regular tomato and vegetable juice (not low-sodium or reduced-sodium). Angie Fava. Olives. Fruits Canned fruit in a light or heavy syrup. Fried fruit. Fruit in cream or butter sauce. Meat and other protein foods Fatty cuts  of meat. Ribs. Fried meat. Berniece Salines. Sausage. Bologna and other processed lunch meats. Salami. Fatback. Hotdogs. Bratwurst. Salted nuts and seeds. Canned beans with added salt. Canned or smoked fish. Whole eggs or egg yolks. Chicken or [place] with skin. Dairy Whole or 2% milk, cream, and half-and-half. Whole or full-fat cream cheese. Whole-fat or sweetened yogurt. Full-fat cheese. Nondairy creamers. Whipped toppings. Processed cheese and cheese spreads. Fats and oils Butter. Stick margarine. Lard. Shortening. Ghee. Bacon fat. Tropical oils, such as coconut, palm kernel, or palm oil. Seasoning and other foods Salted popcorn and pretzels. Onion salt, garlic salt, seasoned salt, table salt, and sea salt. Worcestershire sauce. Tartar sauce. Barbecue sauce. Teriyaki sauce. Soy sauce, including reduced-sodium. Steak sauce. Canned and packaged gravies. Fish sauce. Oyster sauce. Cocktail sauce. Horseradish that you find on the shelf. Ketchup. Mustard. Meat flavorings and tenderizers. Bouillon cubes. Hot sauce and Tabasco sauce. Premade or packaged marinades. Premade or packaged taco seasonings. Relishes. Regular salad dressings. Where to find more information:  National Heart, Lung, and : https://wilson-eaton.com/  American Heart Association: www.heart.org Summary  The DASH eating plan is a healthy eating plan that has been shown to reduce high blood pressure (hypertension). It may also reduce your risk for type 2 diabetes, heart disease, and stroke.  With the DASH eating plan, you should limit salt (sodium) intake to 2,300 mg  a day. If you have hypertension, you may need to reduce your sodium intake to 1,500 mg a day.  When on the DASH eating plan, aim to eat more fresh fruits and vegetables, whole grains, lean proteins, low-fat dairy, and heart-healthy fats.  Work with your health care provider or diet and nutrition specialist (dietitian) to adjust your eating plan to your individual calorie  needs. This information is not intended to replace advice given to you by your health care provider. Make sure you discuss any questions you have with your health care provider. Document Released: 08/22/2011 Document Revised: 08/26/2016 Document Reviewed: 08/26/2016 Elsevier Interactive Patient Education  2017 [employer].

## 2017-08-05 NOTE — Telephone Encounter (Signed)
  Reason for Disposition . [1] Numbness or tingling in one or both feet AND [2] is a chronic symptom (recurrent or ongoing AND present > 4 weeks)  Answer Assessment - Initial Assessment Questions 1. SYMPTOM: "What is the main symptom you are concerned about?" (e.g., weakness, numbness)     Tingling and pain in both legs and feet 2. ONSET: "When did this start?" (minutes, hours, days; while sleeping)     2 weeks ago 3. LAST NORMAL: "When was the last time you were normal (no symptoms)?"     2 weeks ago 4. PATTERN "Does this come and go, or has it been constant since it started?"  "Is it present now?"     Constant 5. CARDIAC SYMPTOMS: "Have you had any of the following symptoms: chest pain, difficulty breathing, palpitations?"     No 6. NEUROLOGIC SYMPTOMS: "Have you had any of the following symptoms: headache, dizziness, vision loss, double vision, changes in speech, unsteady on your feet?"     Some blurry vision at time - not wearing glasses 7. OTHER SYMPTOMS: "Do you have any other symptoms?"     Dizziness 8. PREGNANCY: "Is there any chance you are pregnant?" "When was your last menstrual period?"     No  Protocols used: NEUROLOGIC DEFICIT-A-AH Pt. Also states she has " not felt the same since my hysterectomy." More tired, abdominal discomfort. Denies headache. Has appointment for today with provider.

## 2017-08-10 ENCOUNTER — Encounter: Payer: Self-pay | Admitting: Nurse Practitioner

## 2017-08-10 MED ORDER — LISINOPRIL-HYDROCHLOROTHIAZIDE 10-12.5 MG PO TABS: 1.0000 | ORAL_TABLET | Freq: Every day | ORAL | 1 refills | Status: DC

## 2017-08-11 LAB — ANA: ANA: NEGATIVE

## 2017-08-27 ENCOUNTER — Ambulatory Visit: Payer: BLUE CROSS/BLUE SHIELD | Admitting: Nurse Practitioner

## 2017-08-27 ENCOUNTER — Encounter: Payer: Self-pay | Admitting: Nurse Practitioner

## 2017-08-27 VITALS — BP 140/82 | HR 83 | Temp 97.7°F | Ht 69.0 in | Wt 228.0 lb

## 2017-08-27 DIAGNOSIS — I10 Essential (primary) hypertension: Secondary | ICD-10-CM | POA: Diagnosis not present

## 2017-08-27 DIAGNOSIS — E669 Obesity, unspecified: Secondary | ICD-10-CM | POA: Diagnosis not present

## 2017-08-27 DIAGNOSIS — F5089 Other specified eating disorder: Secondary | ICD-10-CM | POA: Diagnosis not present

## 2017-08-27 MED ORDER — ONDANSETRON HCL 4 MG PO TABS: 4.0000 mg | ORAL_TABLET | Freq: Three times a day (TID) | ORAL | 0 refills | Status: DC | PRN

## 2017-08-27 NOTE — Patient Instructions (Addendum)
Use of menthol ointment around nose while at work to minimize strong smell.

## 2017-08-27 NOTE — Assessment & Plan Note (Signed)
Improved BP with lisinopril-HCTZ and diet changes. BP Readings from Last 3 Encounters:  08/27/17 140/82  08/05/17 (!) 156/82  11/18/16 138/80

## 2017-08-27 NOTE — Progress Notes (Signed)
Subjective:  Patient ID: Kimberly Snow, female    DOB: [birth year]  Age: 51 y.o. MRN: [id number]  CC: Nausea (nausea,cant go to work,thinking its the job: working with chicken cooking process. new job,not even two weeks. )   HPI  HTN: Improved with change in diet changes (low sodium and small portions) and resuming medication. BP Readings from Last 3 Encounters:  08/27/17 140/82  08/05/17 (!) 156/82  11/18/16 138/80   Nausea: Onset 2weeks ago. Nausea related to work environment(works in a [employer] fundraiser). Reports uncontrollable nausea and vomitng while at work.  Outpatient Medications Prior to Visit  Medication Sig Dispense Refill  . ferrous sulfate 325 (65 FE) MG tablet Take 325 mg by mouth daily with breakfast.    . hydrOXYzine (ATARAX/VISTARIL) 50 MG tablet Take 50 mg by mouth every 6 (six) hours as needed.    Marland Kitchen lisinopril-hydrochlorothiazide (PRINZIDE,ZESTORETIC) 10-12.5 MG tablet Take 1 tablet by mouth daily. 90 tablet 1   No facility-administered medications prior to visit.     ROS Review of Systems  Constitutional: Negative for fever and malaise/fatigue.  Respiratory: Negative for sputum production.   Cardiovascular: Negative for chest pain and leg swelling.  Gastrointestinal: Positive for nausea and vomiting. Negative for abdominal pain, blood in stool, constipation, diarrhea, heartburn and melena.  Genitourinary: Negative.   Musculoskeletal: Negative.   Neurological: Negative.     Objective:  BP 140/82   Pulse 83   Temp 97.7 F (36.5 C)   Ht 5\' 9"  (1.753 m)   Wt 228 lb (103.4 kg)   LMP 07/30/2015   SpO2 99%   BMI 33.67 kg/m   BP Readings from Last 3 Encounters:  08/27/17 140/82  08/05/17 (!) 156/82  11/18/16 138/80    Wt Readings from Last 3 Encounters:  08/27/17 228 lb (103.4 kg)  08/05/17 231 lb (104.8 kg)  11/18/16 230 lb (104.3 kg)    Physical Exam  Constitutional: Kimberly Snow is oriented to person, place, and time. No distress.    Cardiovascular: Normal rate and regular rhythm.  Pulmonary/Chest: Effort normal and breath sounds normal.  Musculoskeletal: Kimberly Snow exhibits no edema.  Neurological: Kimberly Snow is alert and oriented to person, place, and time.  Skin: Skin is warm and dry.  Vitals reviewed.   Lab Results  Component Value Date   WBC 7.6 08/05/2017   HGB 12.4 08/05/2017   HCT 38.6 08/05/2017   PLT 298.0 08/05/2017   GLUCOSE 104 (H) 08/05/2017   CHOL 215 (H) 08/05/2017   TRIG 101.0 08/05/2017   HDL 41.40 08/05/2017   LDLCALC 154 (H) 08/05/2017   ALT 12 08/05/2017   AST 13 08/05/2017   NA 137 08/05/2017   K 3.6 08/05/2017   CL 102 08/05/2017   CREATININE 0.72 08/05/2017   BUN 11 08/05/2017   CO2 30 08/05/2017   TSH 1.21 08/05/2017   HGBA1C 5.3 05/22/2016    Assessment & Plan:   Kimberly Snow was seen today for nausea.  Diagnoses and all orders for this visit:  Psychogenic vomiting with nausea -     ondansetron (ZOFRAN) 4 MG tablet; Take 1 tablet (4 mg total) by mouth every 8 (eight) hours as needed for nausea or vomiting.  Essential hypertension  Obesity (BMI 30.0-34.9)   I am having Kimberly Snow start on ondansetron. I am also having Kimberly Snow maintain Kimberly Snow ferrous sulfate, hydrOXYzine, and lisinopril-hydrochlorothiazide.  Meds ordered this encounter  Medications  . ondansetron (ZOFRAN) 4 MG tablet    Sig: Take 1 tablet (4 mg  total) by mouth every 8 (eight) hours as needed for nausea or vomiting.    Dispense:  20 tablet    Refill:  0    Order Specific Question:   Supervising Provider    Answer:   Lucille Passy [[id number]]    Follow-up: Return in about 3 months (around 11/25/2017) for HTN and weight loss (fasting).Wilfred Lacy, NP

## 2017-08-27 NOTE — Assessment & Plan Note (Signed)
Hold on prescribing phentermine at this time due to nausea cause by smell at work environment. She will continue diet and exercise at this time.

## 2017-08-27 NOTE — Assessment & Plan Note (Signed)
>>  ASSESSMENT AND PLAN FOR OBESITY (BMI 30.0-34.9) WRITTEN ON 08/27/2017 12:47 PM BY Peggye Poon LUM, NP  Hold on prescribing phentermine at this time due to nausea cause by smell at work environment. She will continue diet and exercise at this time.

## 2017-09-05 ENCOUNTER — Ambulatory Visit: Payer: BLUE CROSS/BLUE SHIELD | Admitting: Nurse Practitioner

## 2017-11-25 ENCOUNTER — Ambulatory Visit: Payer: BLUE CROSS/BLUE SHIELD | Admitting: Nurse Practitioner

## 2018-03-23 DIAGNOSIS — L905 Scar conditions and fibrosis of skin: Secondary | ICD-10-CM | POA: Insufficient documentation

## 2018-03-23 DIAGNOSIS — R319 Hematuria, unspecified: Secondary | ICD-10-CM | POA: Insufficient documentation

## 2018-03-23 DIAGNOSIS — N898 Other specified noninflammatory disorders of vagina: Secondary | ICD-10-CM | POA: Insufficient documentation

## 2018-03-23 DIAGNOSIS — R300 Dysuria: Secondary | ICD-10-CM | POA: Insufficient documentation

## 2018-03-23 DIAGNOSIS — R52 Pain, unspecified: Secondary | ICD-10-CM

## 2018-03-23 DIAGNOSIS — K625 Hemorrhage of anus and rectum: Secondary | ICD-10-CM | POA: Insufficient documentation

## 2018-03-24 ENCOUNTER — Encounter: Payer: BLUE CROSS/BLUE SHIELD | Admitting: Nurse Practitioner

## 2018-03-27 ENCOUNTER — Ambulatory Visit (INDEPENDENT_AMBULATORY_CARE_PROVIDER_SITE_OTHER): Payer: BLUE CROSS/BLUE SHIELD | Admitting: Nurse Practitioner

## 2018-03-27 ENCOUNTER — Encounter: Payer: Self-pay | Admitting: Nurse Practitioner

## 2018-03-27 VITALS — BP 174/88 | HR 97 | Temp 98.2°F | Ht 69.0 in | Wt 237.0 lb

## 2018-03-27 DIAGNOSIS — A539 Syphilis, unspecified: Secondary | ICD-10-CM

## 2018-03-27 DIAGNOSIS — Z8619 Personal history of other infectious and parasitic diseases: Secondary | ICD-10-CM

## 2018-03-27 DIAGNOSIS — A530 Latent syphilis, unspecified as early or late: Secondary | ICD-10-CM

## 2018-03-27 DIAGNOSIS — F410 Panic disorder [episodic paroxysmal anxiety] without agoraphobia: Secondary | ICD-10-CM

## 2018-03-27 DIAGNOSIS — R451 Restlessness and agitation: Secondary | ICD-10-CM | POA: Insufficient documentation

## 2018-03-27 MED ORDER — HYDROXYZINE HCL 50 MG PO TABS
50.0000 mg | ORAL_TABLET | Freq: Three times a day (TID) | ORAL | 0 refills | Status: DC | PRN
Start: 2018-03-27 — End: 2019-02-25

## 2018-03-27 NOTE — Progress Notes (Addendum)
Subjective:  Patient ID: Kimberly Snow, female    DOB: [birth year]  Age: 52 y.o. MRN: [id number]  CC: Results (POSITIVE RPR per wellness clinic at work)  HPI Kimberly Snow presents in office today for lab clarification. She developed vaginal itching and discharge last week. She was evaluated by provider at wellness clinic. She was treated for candida vaginitis. She was also had positive RPR and FTA-Abs per Dr. Davene Costain documentation. 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 She was married at the time. Ex-husband was also treated. She has son in 88s who was born after treatment was completed. She currently has 56male partners. Uses condoms at all times. She will like syphilis to be repeated.  Outpatient Medications Prior to Visit  Medication Sig Dispense Refill  . BIOTIN PO Take by mouth.    . hydrOXYzine (ATARAX/VISTARIL) 50 MG tablet Take 50 mg by mouth every 6 (six) hours as needed.    Marland Kitchen albuterol (PROVENTIL HFA;VENTOLIN HFA) 108 (90 Base) MCG/ACT inhaler Inhale into the lungs.    . ferrous sulfate 325 (65 FE) MG tablet Take 325 mg by mouth daily with breakfast.    . lisinopril-hydrochlorothiazide (PRINZIDE,ZESTORETIC) 10-12.5 MG tablet Take 1 tablet by mouth daily. (Patient not taking: Reported on 03/27/2018) 90 tablet 1  . ondansetron (ZOFRAN) 4 MG tablet Take 1 tablet (4 mg total) by mouth every 8 (eight) hours as needed for nausea or vomiting. (Patient not taking: Reported on 03/27/2018) 20 tablet 0   No facility-administered medications prior to visit.     ROS Review of Systems  Constitutional: Negative.   Genitourinary: Negative.   Skin: Negative.   Psychiatric/Behavioral: The patient is nervous/anxious.      Objective:  BP (!) 174/88   Pulse 97   Temp 98.2 F (36.8 C) (Oral)   Ht 5\' 9"  (1.753 m)   Wt 237 lb (107.5 kg)   LMP 07/30/2015   SpO2 99%   BMI 35.00 kg/m   BP Readings from Last 3 Encounters:  03/27/18 (!) 174/88  08/27/17  140/82  08/05/17 (!) 156/82    Wt Readings from Last 3 Encounters:  03/27/18 237 lb (107.5 kg)  08/27/17 228 lb (103.4 kg)  08/05/17 231 lb (104.8 kg)    Physical Exam  Cardiovascular: Normal rate.  Pulmonary/Chest: Effort normal.  Skin: No rash noted.  Psychiatric: Her mood appears anxious. She is agitated. Cognition and memory are normal.  Vitals reviewed.  Lab Results  Component Value Date   WBC 7.6 08/05/2017   HGB 12.4 08/05/2017   HCT 38.6 08/05/2017   PLT 298.0 08/05/2017   GLUCOSE 104 (H) 08/05/2017   CHOL 215 (H) 08/05/2017   TRIG 101.0 08/05/2017   HDL 41.40 08/05/2017   LDLCALC 154 (H) 08/05/2017   ALT 12 08/05/2017   AST 13 08/05/2017   NA 137 08/05/2017   K 3.6 08/05/2017   CL 102 08/05/2017   CREATININE 0.72 08/05/2017   BUN 11 08/05/2017   CO2 30 08/05/2017   TSH 1.21 08/05/2017   HGBA1C 5.3 05/22/2016    Assessment & Plan:   Kimberly Snow was seen today for results.  Diagnoses and all orders for this visit:  Syphilis -     Cancel: RPR -     Cancel: Fluorescent treponemal ab(fta)-IgG-bld -     Penicillin G Benzathine 2400000 UNIT/4ML SUSP; Inject 4 mLs into the muscle once for 1 dose. -     Ambulatory referral to Infectious Disease  Hx  of syphilis -     Cancel: RPR -     Cancel: Fluorescent treponemal ab(fta)-IgG-bld -     RPR -     Fluorescent treponemal ab(fta)-IgG-bld -     Penicillin G Benzathine 2400000 UNIT/4ML SUSP; Inject 4 mLs into the muscle once for 1 dose. -     Ambulatory referral to Infectious Disease  Panic attacks -     hydrOXYzine (ATARAX/VISTARIL) 50 MG tablet; Take 1 tablet (50 mg total) by mouth every 8 (eight) hours as needed for anxiety.  Other orders -     Rpr titer   I have changed Kimberly Snow's hydrOXYzine. I am also having her start on Penicillin G Benzathine. Additionally, I am having her maintain her ferrous sulfate, lisinopril-hydrochlorothiazide, ondansetron, albuterol, and BIOTIN PO.  Meds ordered this  encounter  Medications  . hydrOXYzine (ATARAX/VISTARIL) 50 MG tablet    Sig: Take 1 tablet (50 mg total) by mouth every 8 (eight) hours as needed for anxiety.    Dispense:  30 tablet    Refill:  0    Order Specific Question:   Supervising Provider    Answer:   MATTHEWS, CODY [[id number]]  . Penicillin G Benzathine 2400000 UNIT/4ML SUSP    Sig: Inject 4 mLs into the muscle once for 1 dose.    Dispense:  4 mL    Refill:  0    Order Specific Question:   Supervising Provider    Answer:   MATTHEWS, CODY [[id number]]    Follow-up: Return if symptoms worsen or fail to improve.  Wilfred Lacy, NP

## 2018-03-27 NOTE — Patient Instructions (Addendum)
You will be contacted with lab results. I will only get your lab results on Monday. Please inform your sex partner of lab results.   Syphilis Test Why am I having this test? Syphilis is an infectious disease caused by a type of bacteria called Treponema pallidum. It is most commonly spread through sexual contact. Syphilis may also spread to a fetus through the blood of the mother. You may have this test if you have symptoms of syphilis, such as a painless sore (chancre). Syphilis symptoms resemble the symptoms of many other conditions. Early symptoms may go away before new symptoms develop as the disease progresses. Untreated syphilis can lead to severe complications. Ultimately, the bacteria can damage the heart, brain, and nervous system. Pregnant women often have a syphilis test. You may also have this test if you are at risk of the disease because of:  Having a sexual partner with syphilis.  Engaging in high-risk sexual activity.  Having another sexually transmitted infection, such as gonorrhea.  What kind of sample is taken? A blood test is the most common way to test for syphilis. This requires taking a blood sample from a vein in your hand or arm. The blood test checks for antibodies to the bacteria that cause syphilis. Antibodies are proteins your body makes in response to germs and other things that can make you sick. There are two types of blood tests to check for syphilis. Both tests are necessary to make a definite diagnosis.  Nontreponemal test. ? This is usually the first test. ? This test can also detect antibodies to other proteins and may give a positive result for syphilis even if you do not have the condition (false positive).  Treponemal test. ? This test is done if you get a positive result to the nontreponemal test. ? It tests specifically for antibodies to syphilis. ? Other conditions are not likely to cause a positive result. ? This test will not reveal whether  antibodies are from a previous syphilis infection or a recent one.  If your health care provider suspects late-stage syphilis, you may have a spinal tap (lumbar puncture). This is a procedure in which a small amount of the fluid that surrounds the brain and spinal cord (cerebrospinal fluid or CSF) is removed and examined for syphilis. How do I prepare for this test? There is no preparation required for this test. What do the results mean? Your test results will be reported as either negative or positive. If the result of your syphilis test is negative, no antibodies were present at the time of the test. This could mean:  You do not have syphilis.  The antibodies have not formed yet. This can take several weeks. If it is possible that you were recently exposed to the bacteria, you may need to have the test again at a later time.  If you test positive for syphilis on the first nontreponemal test, you will most likely have the second treponemal test to confirm the diagnosis. If the result of the second test is also positive, it is likely that you have syphilis. If the result of the second test is negative, further testing may be needed to make sure you do not have syphilis. Talk to your health care provider to discuss your results, treatment options, and if necessary, the need for more tests. It is your responsibility to obtain your test results. Ask the lab or department performing the test when and how you will get your results. Talk with  your health care provider if you have any questions about your results. Talk with your health care provider to discuss your results, treatment options, and if necessary, the need for more tests. Talk with your health care provider if you have any questions about your results. This information is not intended to replace advice given to you by your health care provider. Make sure you discuss any questions you have with your health care provider. Document Released:  10/05/2004 Document Revised: 05/08/2016 Document Reviewed: 12/27/2013 Elsevier Interactive Patient Education  [employer].

## 2018-03-30 DIAGNOSIS — A539 Syphilis, unspecified: Secondary | ICD-10-CM | POA: Insufficient documentation

## 2018-03-30 LAB — RPR TITER

## 2018-03-30 LAB — RPR: RPR Ser Ql: REACTIVE — AB

## 2018-03-30 LAB — FLUORESCENT TREPONEMAL AB(FTA)-IGG-BLD: Fluorescent Treponemal ABS: REACTIVE — AB

## 2018-03-30 MED ORDER — PENICILLIN G BENZATHINE 2400000 UNIT/4ML IM SUSP: 4.0000 mL | Freq: Once | INTRAMUSCULAR | 0 refills | Status: AC

## 2018-03-30 NOTE — Addendum Note (Signed)
Addended by: Wilfred Lacy L on: 03/30/2018 02:57 PM   Modules accepted: Orders

## 2018-03-31 ENCOUNTER — Telehealth: Payer: Self-pay | Admitting: Nurse Practitioner

## 2018-03-31 NOTE — Telephone Encounter (Signed)
FYI: pt stated she will pickup medication on Friday and have her work place [employer], television/film[employer]video it due to point system if they miss time from work.     Copied from [place] [id number]. Topic: General - Other >> Mar 31, 2018 10:43 AM Oneta Rack wrote:  Relation to BO:QUCL Call back number:[phone]   Reason for call:  Patient was last seen 03/27/18 and states due to medication being expensive she will pick up Rx on Friday and wanted to see the nurse on Friday (informed nurses are unavailable on Fridays). Patient would like to speak with nurse regarding Rx and administrating the medication, please advise

## 2018-03-31 NOTE — Telephone Encounter (Signed)
Ok. She needs to maintain appt with infectious disease when scheduled. She needs to notify her partners of Syphilis diagnosis

## 2018-04-03 ENCOUNTER — Ambulatory Visit: Payer: BLUE CROSS/BLUE SHIELD

## 2018-04-07 ENCOUNTER — Ambulatory Visit: Payer: BLUE CROSS/BLUE SHIELD

## 2018-04-08 ENCOUNTER — Ambulatory Visit (INDEPENDENT_AMBULATORY_CARE_PROVIDER_SITE_OTHER): Payer: BLUE CROSS/BLUE SHIELD | Admitting: Behavioral Health

## 2018-04-08 DIAGNOSIS — A539 Syphilis, unspecified: Secondary | ICD-10-CM | POA: Diagnosis not present

## 2018-04-08 MED ORDER — PENICILLIN G BENZATHINE 1200000 UNIT/2ML IM SUSP
2.4000 10*6.[IU] | Freq: Once | INTRAMUSCULAR | Status: AC
  Administered 2018-04-08: 2.4 10*6.[IU] via INTRAMUSCULAR

## 2018-04-08 NOTE — Progress Notes (Signed)
Patient presents in clinic to have penicillin injection administered per lab note 03/27/18. IM injection was given and tolerated well by the patient. No signs or symptoms of a reaction prior to patient leaving the visit.

## 2018-04-27 ENCOUNTER — Ambulatory Visit: Payer: Self-pay | Admitting: Family

## 2018-04-27 ENCOUNTER — Telehealth: Payer: Self-pay | Admitting: Family

## 2018-04-27 NOTE — Telephone Encounter (Signed)
Ms. Kimberly Snow was scheduled for an office visit and was very irate because of co-pay and associated fees. We discussed briefly the recommendation that she receive 2 additional Bicillin injections as this could be late latent syphilis. Her options described includeded to go [facility], back to primary care or stay here for the injections. She stated she had the injections at home and requested to leave. Her appointment was thus cancelled at that time.

## 2018-06-15 ENCOUNTER — Telehealth: Payer: Self-pay | Admitting: Nurse Practitioner

## 2018-06-15 NOTE — Telephone Encounter (Signed)
I need copy of mammogram results that was done. I do not see it in chart.

## 2018-06-15 NOTE — Telephone Encounter (Signed)
Kimberly Snow please advise, diagnostic MM order?  Copied from [place] [id number]. Topic: Inquiry >> Jun 15, 2018 10:00 AM [name] B wrote: Reason for CRM: pt called b/c she took a mammogram 2 weeks ago that resulted abnormal; pt is needing another order so that she can return to [facility] to take another and is needing it signed by her pcp; contact pt if needed

## 2018-06-15 NOTE — Telephone Encounter (Signed)
Call pt multiple time (vm not set up). Need to give the pt our fax # [phone] for her to fax over the result of MM.

## 2018-06-16 NOTE — Telephone Encounter (Signed)
Pt will fax over the result.

## 2018-07-03 ENCOUNTER — Telehealth: Payer: Self-pay | Admitting: Nurse Practitioner

## 2018-07-03 ENCOUNTER — Other Ambulatory Visit (HOSPITAL_COMMUNITY)
Admission: RE | Admit: 2018-07-03 | Discharge: 2018-07-03 | Disposition: A | Discharge status: Home / Self Care | Payer: 59 | Source: Ambulatory Visit | Attending: Family Medicine | Admitting: Family Medicine

## 2018-07-03 ENCOUNTER — Encounter: Payer: Self-pay | Admitting: Family Medicine

## 2018-07-03 ENCOUNTER — Encounter: Payer: Self-pay | Admitting: Nurse Practitioner

## 2018-07-03 ENCOUNTER — Ambulatory Visit (INDEPENDENT_AMBULATORY_CARE_PROVIDER_SITE_OTHER): Payer: 59 | Admitting: Family Medicine

## 2018-07-03 VITALS — BP 142/80 | HR 98 | Temp 98.0°F | Wt 239.6 lb

## 2018-07-03 DIAGNOSIS — N6320 Unspecified lump in the left breast, unspecified quadrant: Secondary | ICD-10-CM

## 2018-07-03 DIAGNOSIS — N898 Other specified noninflammatory disorders of vagina: Secondary | ICD-10-CM | POA: Diagnosis not present

## 2018-07-03 DIAGNOSIS — R300 Dysuria: Secondary | ICD-10-CM | POA: Insufficient documentation

## 2018-07-03 LAB — POC URINALSYSI DIPSTICK (AUTOMATED)
Bilirubin, UA: NEGATIVE
Glucose, UA: NEGATIVE
Ketones, UA: NEGATIVE
Leukocytes, UA: NEGATIVE
Nitrite, UA: NEGATIVE
PROTEIN UA: NEGATIVE
RBC UA: 1
SPEC GRAV UA: 1.025 (ref 1.010–1.025)
UROBILINOGEN UA: 0.2 U/dL
pH, UA: 6 (ref 5.0–8.0)

## 2018-07-03 NOTE — Assessment & Plan Note (Signed)
-  Testing done for BV/GC/Chlamydia/Trich/Yeast. -No CMT on exam to suggest PID. -Urine with small blood, sent for culture.  -Unclear if previous syphilis has been adequately treated, will check with PCP to see if she has any additional records.

## 2018-07-03 NOTE — Patient Instructions (Signed)
-  We will be in touch with results of lab work.  This typically takes a few days.

## 2018-07-03 NOTE — Progress Notes (Signed)
Kimberly Snow - 52 y.o. female MRN [id number]  Date of birth: 03-Jun-1966  Subjective Chief Complaint  Patient presents with  . Dysuria    HPI Kimberly Snow is a 52 y.o. female with history of syphilis, diagnosed in 03/2018 here today with c/o vaginal discharge and dysuria and foul odor to urine.  Reports that symptoms began about two months ago but hasn't been seen due to her schedule being busy.   Has had urine frequency and urgency associated with this.   Describes discharge as thick and [place].  She has had some associated low back pain and pelvic pressure.  She denies fever, chills or flank pain, rash, nausea.  Denies sexual partners since diagnosis of syphilis in July.   Regarding syphilis: RPR positive in July and received Bicillin LA 2.4 million units on 04/08/18.  She was subsequently referred to ID for further treatment.  Notes indicate that she became angry about copay at office and refused treatment.  She states that this isn't the case and she was told by ID office that she had already had treatment and wasn't sure why she was there to see them.  It appears that it was explained to her that she could be seen at pcp or health department for additional PCN injections however she reports she had these at home.  Unclear if health department may have came to administer additional injections.    ROS:  A comprehensive ROS was completed and negative except as noted per HPI  Allergies  Allergen Reactions  . Provera  [Medroxyprogesterone Acetate] Other (See Comments)  . Oxycodone-Acetaminophen   . Medroxyprogesterone Anxiety    Past Medical History:  Diagnosis Date  . Abnormal vaginal bleeding   . Anemia   . Anxiety   . Blockage of coronary artery of heart (HCC)    was supposed to have a stent placed, but left hospital  . Fibroids   . Menorrhagia 05/12/2016    Past Surgical History:  Procedure Laterality Date  . ABDOMINAL HYSTERECTOMY  05/13/2017   ovaries still present.  Marland Kitchen  HYSTERECTOMY ABDOMINAL WITH SALPINGECTOMY Bilateral 05/13/2016   Procedure: HYSTERECTOMY ABDOMINAL WITH SALPINGECTOMY with iud removal;  Surgeon: Eldred Manges, MD;  Location: [facility];  Service: Gynecology;  Laterality: Bilateral;  3 Hours  . TUBAL LIGATION    . WISDOM TOOTH EXTRACTION      Social History   Socioeconomic History  . Marital status: Legally Separated    Spouse name: Not on file  . Number of children: Not on file  . Years of education: Not on file  . Highest education level: Not on file  Occupational History  . Not on file  Social Needs  . Financial resource strain: Not on file  . Food insecurity:    Worry: Not on file    Inability: Not on file  . Transportation needs:    Medical: Not on file    Non-medical: Not on file  Tobacco Use  . Smoking status: Never Smoker  . Smokeless tobacco: Never Used  Substance and Sexual Activity  . Alcohol use: No  . Drug use: No  . Sexual activity: Yes    Birth control/protection: Surgical    Comment: ABD hysterectomy without ovaries  Lifestyle  . Physical activity:    Days per week: Not on file    Minutes per session: Not on file  . Stress: Not on file  Relationships  . Social connections:    Talks on phone: Not on file  Gets together: Not on file    Attends religious service: Not on file    Active member of club or organization: Not on file    Attends meetings of clubs or organizations: Not on file    Relationship status: Not on file  Other Topics Concern  . Not on file  Social History Narrative  . Not on file    Family History  Problem Relation Age of Onset  . Diabetes Mother   . Hypertension Mother   . Alcohol abuse Father   . Cancer Father        liver  . Hypertension Brother   . Heart disease Paternal Uncle   . Hypertension Paternal Uncle   . Hyperlipidemia Paternal Uncle     Health Maintenance  Topic Date Due  . PAP SMEAR  09/02/1987  . MAMMOGRAM  09/01/2016  . COLONOSCOPY  09/01/2016  .  INFLUENZA VACCINE  04/16/2018  . TETANUS/TDAP  11/19/2026  . HIV Screening  Completed    ----------------------------------------------------------------------------------------------------------------------------------------------------------------------------------------------------------------- Physical Exam BP (!) 142/80   Pulse 98   Temp 98 F (36.7 C)   Wt 239 lb 9.6 oz (108.7 kg)   LMP 07/30/2015   SpO2 98%   BMI 35.38 kg/m   Physical Exam  Constitutional: She is oriented to person, place, and time. She appears well-nourished. No distress.  HENT:  Head: Normocephalic and atraumatic.  Mouth/Throat: Oropharynx is clear and moist.  Eyes: No scleral icterus.  Neck: Neck supple. No thyromegaly present.  Cardiovascular: Normal rate, regular rhythm and normal heart sounds.  Pulmonary/Chest: Effort normal and breath sounds normal.  Abdominal: Soft. Bowel sounds are normal. She exhibits no distension. There is tenderness (mild suprapubic.  No CVA tenderness). There is no guarding.  Genitourinary: Vagina normal. Cervix exhibits no motion tenderness and no friability. No erythema in the vagina. No signs of injury around the vagina.  Genitourinary Comments: Brannum vaginal discharge.  Pelvic exam completed with chaperone ([place], RMA) present.    Lymphadenopathy:    She has no cervical adenopathy.  Neurological: She is alert and oriented to person, place, and time.  Skin: Skin is warm and dry.  Psychiatric: She has a normal mood and affect. Her behavior is normal.    ------------------------------------------------------------------------------------------------------------------------------------------------------------------------------------------------------------------- Assessment and Plan  Vaginal discharge -Testing done for BV/GC/Chlamydia/Trich/Yeast. -No CMT on exam to suggest PID. -Urine with small blood, sent for culture.  -Unclear if previous syphilis has been  adequately treated, will check with PCP to see if she has any additional records.

## 2018-07-03 NOTE — Progress Notes (Signed)
Kimberly Snow - 52 y.o. female MRN [id number]  Date of birth: [birth year]  Subjective No chief complaint on file.   HPI 52 year old female presents today with odor to urine and frequency that produces minimal urine that began approximately two months ago. Patient denies burning or pain during urination. She reports thick, Rance vaginal discharge but denies itching, swelling, or redness. She reports lower abdominal pressure and lower back pain. After receiving PCN injection in July, she received diflucan and symptoms seem to improve initially. Patient denies any sexual partners at all since Syphilis diagnosis.   Patient states she went to infectious disease appointment and was told that since she received her PCN that she did not have to return. Patient does not report Health Department involvement.   Allergies  Allergen Reactions  . Provera  [Medroxyprogesterone Acetate] Other (See Comments)  . Oxycodone-Acetaminophen   . Medroxyprogesterone Anxiety    Past Medical History:  Diagnosis Date  . Abnormal vaginal bleeding   . Anemia   . Anxiety   . Blockage of coronary artery of heart (HCC)    was supposed to have a stent placed, but left hospital  . Fibroids   . Menorrhagia 05/12/2016    Past Surgical History:  Procedure Laterality Date  . ABDOMINAL HYSTERECTOMY  05/13/2017   ovaries still present.  Marland Kitchen HYSTERECTOMY ABDOMINAL WITH SALPINGECTOMY Bilateral 05/13/2016   Procedure: HYSTERECTOMY ABDOMINAL WITH SALPINGECTOMY with iud removal;  Surgeon: Eldred Manges, MD;  Location: [facility];  Service: Gynecology;  Laterality: Bilateral;  3 Hours  . TUBAL LIGATION    . WISDOM TOOTH EXTRACTION      Social History   Socioeconomic History  . Marital status: Legally Separated    Spouse name: Not on file  . Number of children: Not on file  . Years of education: Not on file  . Highest education level: Not on file  Occupational History  . Not on file  Social Needs  . Financial resource  strain: Not on file  . Food insecurity:    Worry: Not on file    Inability: Not on file  . Transportation needs:    Medical: Not on file    Non-medical: Not on file  Tobacco Use  . Smoking status: Never Smoker  . Smokeless tobacco: Never Used  Substance and Sexual Activity  . Alcohol use: No  . Drug use: No  . Sexual activity: Yes    Birth control/protection: Surgical    Comment: ABD hysterectomy without ovaries  Lifestyle  . Physical activity:    Days per week: Not on file    Minutes per session: Not on file  . Stress: Not on file  Relationships  . Social connections:    Talks on phone: Not on file    Gets together: Not on file    Attends religious service: Not on file    Active member of club or organization: Not on file    Attends meetings of clubs or organizations: Not on file    Relationship status: Not on file  Other Topics Concern  . Not on file  Social History Narrative  . Not on file    Family History  Problem Relation Age of Onset  . Diabetes Mother   . Hypertension Mother   . Alcohol abuse Father   . Cancer Father        liver  . Hypertension Brother   . Heart disease Paternal Uncle   . Hypertension Paternal Uncle   .  Hyperlipidemia Paternal Uncle     Health Maintenance  Topic Date Due  . PAP SMEAR  09/02/1987  . MAMMOGRAM  09/01/2016  . COLONOSCOPY  09/01/2016  . INFLUENZA VACCINE  04/16/2018  . TETANUS/TDAP  11/19/2026  . HIV Screening  Completed   Sexual History Patient reports not having vaginal, oral, or anal sexual practices with either female or female partners since Syphilis Diagnosis in July.   ROS General: Patient reports adequate energy. Patient denies weight loss or weight gain.  Cardiac: Patient denies chest pain, pressure, palpitations.  Pulmonary: Denies shortness of breath, dyspnea, or cough.  GU/GYN: see  HPI  ----------------------------------------------------------------------------------------------------------------------------------------------------------------------------------------------------------------- Physical Exam BP (!) 142/80   Pulse 98   Temp 98 F (36.7 C)   Wt 239 lb 9.6 oz (108.7 kg)   LMP 07/30/2015   SpO2 98%   BMI 35.38 kg/m   Physical Exam  Constitutional: She is oriented to person, place, and time. She appears well-developed and well-nourished.  Eyes: Conjunctivae are normal. No scleral icterus.  Cardiovascular: Normal rate, regular rhythm and normal heart sounds.  Pulmonary/Chest: Effort normal and breath sounds normal.  Abdominal: Soft. Bowel sounds are normal. There is no tenderness. There is no guarding.  Genitourinary:  Genitourinary Comments: External: genitalia without erythema, edema, or lesions. Urethra: without erythema. Vaginal mucosa: pink with thin, [place], non-malodorous discharge. Cervix: pink, no discharge noted, no CMT.    Neurological: She is alert and oriented to person, place, and time.  Skin: Skin is warm and dry.  Psychiatric: She has a normal mood and affect.    ------------------------------------------------------------------------------------------------------------------------------------------------------------------------------------------------------------------- Assessment and Plan  Odorous Urine -Dipstick Urine that showed few RBCs. Send for culture -Speculum exam with STI panel testing as well as yeast.  -Will call with results. Return if symptoms worsen. Warning signs reviewed.

## 2018-07-03 NOTE — Telephone Encounter (Signed)
Patient came into the office and dropped off mammogram report from outside imaging facility. Needs order sent to [facility] for further eval. Patient has imaging on CD disc to take to [facility] Imaging report scanned in record and left at the front desk for pick up. Please contact patient.

## 2018-07-05 LAB — URINE CULTURE
MICRO NUMBER:: 91255448
SPECIMEN QUALITY: ADEQUATE

## 2018-07-06 ENCOUNTER — Other Ambulatory Visit: Payer: Self-pay | Admitting: Nurse Practitioner

## 2018-07-06 ENCOUNTER — Telehealth: Payer: Self-pay | Admitting: Nurse Practitioner

## 2018-07-06 DIAGNOSIS — R928 Other abnormal and inconclusive findings on diagnostic imaging of breast: Secondary | ICD-10-CM

## 2018-07-06 DIAGNOSIS — N6320 Unspecified lump in the left breast, unspecified quadrant: Secondary | ICD-10-CM

## 2018-07-06 NOTE — Telephone Encounter (Signed)
Unable to leave v/m. Will try again later

## 2018-07-06 NOTE — Telephone Encounter (Signed)
Placed MM result on [employer]. Please advise.

## 2018-07-06 NOTE — Telephone Encounter (Signed)
Pt aware of message below. appt made with [facility] 07/08/18.

## 2018-07-06 NOTE — Telephone Encounter (Signed)
-----   Message from Luetta Nutting, DO sent at 07/03/2018  4:44 PM EDT ----- Baldo Ash-  I saw this patient for discharge and dysuria.  Noted previous syphilis. S/p Bicillin injection and planned f/u with ID.  Doesn't look like she was seen by ID.  Unclear if she may have gotten additional injections at home, ?Health department, wasn't sure if you had any additional records on her. Also she said she dropped off mammogram results today for you to review.

## 2018-07-07 ENCOUNTER — Other Ambulatory Visit: Payer: Self-pay | Admitting: Family Medicine

## 2018-07-07 ENCOUNTER — Telehealth: Payer: Self-pay | Admitting: Nurse Practitioner

## 2018-07-07 LAB — CERVICOVAGINAL ANCILLARY ONLY
Bacterial vaginitis: POSITIVE — AB
CANDIDA VAGINITIS: NEGATIVE
CHLAMYDIA, DNA PROBE: NEGATIVE
Neisseria Gonorrhea: NEGATIVE
Trichomonas: NEGATIVE

## 2018-07-07 MED ORDER — FLUCONAZOLE 150 MG PO TABS: 150.0000 mg | ORAL_TABLET | Freq: Once | ORAL | 0 refills | Status: AC

## 2018-07-07 MED ORDER — NITROFURANTOIN MONOHYD MACRO 100 MG PO CAPS: 100.0000 mg | ORAL_CAPSULE | Freq: Two times a day (BID) | ORAL | 0 refills | Status: AC

## 2018-07-07 NOTE — Telephone Encounter (Signed)
Copied from [place] [id number]. Topic: General - Other >> Jul 07, 2018  9:41 AM Lennox Solders wrote: Reason for CRM: pt was prescribed abx. Pt said she gets a yeast infection when taking abx. Pt would like diflucan call into [facility]

## 2018-07-07 NOTE — Telephone Encounter (Signed)
Would like prescription sent in to the pharmacy?

## 2018-07-07 NOTE — Progress Notes (Signed)
Culture shows UTI, rx for macrobid sent in. Additional labs from pelvic exam are still pending.

## 2018-07-07 NOTE — Telephone Encounter (Signed)
Rx has been sent in. 

## 2018-07-07 NOTE — Telephone Encounter (Signed)
Please send diflucan 150mg  #1, 0 refills to pharmacy.

## 2018-07-08 ENCOUNTER — Ambulatory Visit: Payer: 59 | Admitting: Nurse Practitioner

## 2018-07-08 ENCOUNTER — Other Ambulatory Visit: Payer: Self-pay | Admitting: Family Medicine

## 2018-07-08 MED ORDER — METRONIDAZOLE 500 MG PO TABS: 500.0000 mg | ORAL_TABLET | Freq: Two times a day (BID) | ORAL | 0 refills | Status: DC

## 2018-07-08 NOTE — Progress Notes (Signed)
She also has BV. Rx for metronidazole sent in.  She should take this in combination with nitrofurantoin.

## 2018-07-14 ENCOUNTER — Ambulatory Visit
Admission: RE | Admit: 2018-07-14 | Discharge: 2018-07-14 | Disposition: A | Discharge status: Home / Self Care | Payer: 59 | Source: Ambulatory Visit | Attending: Nurse Practitioner | Admitting: Nurse Practitioner

## 2018-07-14 ENCOUNTER — Other Ambulatory Visit: Payer: 59

## 2018-07-14 DIAGNOSIS — R928 Other abnormal and inconclusive findings on diagnostic imaging of breast: Secondary | ICD-10-CM

## 2019-02-11 ENCOUNTER — Telehealth: Payer: Self-pay | Admitting: Nurse Practitioner

## 2019-02-11 ENCOUNTER — Ambulatory Visit (INDEPENDENT_AMBULATORY_CARE_PROVIDER_SITE_OTHER): Payer: 59 | Admitting: Nurse Practitioner

## 2019-02-11 ENCOUNTER — Encounter: Payer: Self-pay | Admitting: Nurse Practitioner

## 2019-02-11 VITALS — BP 147/96 | Temp 97.5°F | Ht 69.0 in | Wt 234.0 lb

## 2019-02-11 DIAGNOSIS — F410 Panic disorder [episodic paroxysmal anxiety] without agoraphobia: Secondary | ICD-10-CM | POA: Diagnosis not present

## 2019-02-11 MED ORDER — BUSPIRONE HCL 7.5 MG PO TABS: 7.5000 mg | ORAL_TABLET | Freq: Three times a day (TID) | ORAL | 0 refills | Status: DC

## 2019-02-11 NOTE — Progress Notes (Signed)
Virtual Visit via Video Note  I connected with Kimberly Snow on 02/11/19 at  1:30 PM EDT by a video enabled telemedicine application and verified that I am speaking with the correct person using two identifiers.  Location: Patient: in her car Provider: Office   I discussed the limitations of evaluation and management by telemedicine and the availability of in person appointments. The patient expressed understanding and agreed to proceed.  CC: pt report anxiety, SOB at times when she wear mask at work/pt is working 12 hrs shift and it mandatory to wear it at work (works in [employer]).  History of Present Illness: Anxiety  Presents for follow-up visit. Symptoms include hyperventilation, insomnia, muscle tension, nervous/anxious behavior, palpitations, panic and restlessness. Patient reports no chest pain, confusion, decreased concentration, depressed mood, dizziness, excessive worry, feeling of choking, irritability or suicidal ideas. Symptoms occur most days. The severity of symptoms is interfering with daily activities. The quality of sleep is poor. Nighttime awakenings: one to two.   Treatment side effects: sedation with vistaril.  recent panic attacks triggered by use of mask due pandemic crisis.   Reviewed medication and problem list.  Observations/Objective: Physical Exam  Constitutional: She is oriented to person, place, and time. No distress.  Pulmonary/Chest: Effort normal.  Neurological: She is alert and oriented to person, place, and time.  Psychiatric: Her speech is normal and behavior is normal. Thought content normal. Her mood appears anxious.   Assessment and Plan: [name] was seen today for follow-up.  Diagnoses and all orders for this visit:  Panic attacks -     busPIRone (BUSPAR) 7.5 MG tablet; Take 1 tablet (7.5 mg total) by mouth 3 (three) times daily.   Follow Up Instructions: F/up in 2weeks. Return to work on Monday. Use loose scarf or mask open at  bottom for face covering. Avoid caffeine use   I discussed the assessment and treatment plan with the patient. The patient was provided an opportunity to ask questions and all were answered. The patient agreed with the plan and demonstrated an understanding of the instructions.   The patient was advised to call back or seek an in-person evaluation if the symptoms worsen or if the condition fails to improve as anticipated.   Wilfred Lacy, NP

## 2019-02-11 NOTE — Patient Instructions (Addendum)
F/up in 2weeks. Return to work on Monday. Use loose scarf or mask open at bottom for face covering. Avoid caffeine use.

## 2019-02-11 NOTE — Telephone Encounter (Signed)
Waiting for signature

## 2019-02-11 NOTE — Telephone Encounter (Signed)
Patient came into office requesting work note. Per chart, patient has a letter for work. Patient needs a signed work note. Patient given work note that has not been signed to give to her job due to job being adamant about note for work. Patient stated she needs the signed letter. Patient informed that she will receive a call when letter has been signed and ready to be picked up.

## 2019-02-12 NOTE — Telephone Encounter (Signed)
Pt will call back with the fax #.

## 2019-02-25 ENCOUNTER — Ambulatory Visit (INDEPENDENT_AMBULATORY_CARE_PROVIDER_SITE_OTHER): Payer: 59 | Admitting: Nurse Practitioner

## 2019-02-25 ENCOUNTER — Encounter: Payer: Self-pay | Admitting: Nurse Practitioner

## 2019-02-25 VITALS — BP 177/100 | Ht 69.0 in

## 2019-02-25 DIAGNOSIS — A539 Syphilis, unspecified: Secondary | ICD-10-CM

## 2019-02-25 DIAGNOSIS — N951 Menopausal and female climacteric states: Secondary | ICD-10-CM

## 2019-02-25 DIAGNOSIS — I10 Essential (primary) hypertension: Secondary | ICD-10-CM | POA: Diagnosis not present

## 2019-02-25 DIAGNOSIS — E785 Hyperlipidemia, unspecified: Secondary | ICD-10-CM | POA: Diagnosis not present

## 2019-02-25 DIAGNOSIS — F410 Panic disorder [episodic paroxysmal anxiety] without agoraphobia: Secondary | ICD-10-CM

## 2019-02-25 MED ORDER — BUSPIRONE HCL 7.5 MG PO TABS: 7.5000 mg | ORAL_TABLET | Freq: Three times a day (TID) | ORAL | 2 refills | Status: DC | PRN

## 2019-02-25 MED ORDER — AMLODIPINE BESYLATE 10 MG PO TABS: 10.0000 mg | ORAL_TABLET | Freq: Every evening | ORAL | 5 refills | Status: DC

## 2019-02-25 MED ORDER — PAROXETINE HCL 10 MG PO TABS: 10.0000 mg | ORAL_TABLET | Freq: Every day | ORAL | 5 refills | Status: DC

## 2019-02-25 NOTE — Assessment & Plan Note (Signed)
Noncompliant with lisinopril/HCTZ due to lip swelling, myalgia, and unilateral facial numbness. D/c lisinopril/HCTZ Start amlodipine. Repeat CMP, TSH. F/up in 1week  Consider sleep study?

## 2019-02-25 NOTE — Progress Notes (Signed)
Virtual Visit via Video Note  I connected with Kimberly Snow on 02/25/19 at  1:30 PM EDT by a video enabled telemedicine application and verified that I am speaking with the correct person using two identifiers.  Location: Patient: home  Provider: office   I discussed the limitations of evaluation and management by telemedicine and the availability of in person appointments. The patient expressed understanding and agreed to proceed.  CC: f/u on  anxiety--improved with buspar pt c/ of high BP reading yesterday 173/102 and headache, Right side jaw or cornier of lips numbness with lisinopril/HCTZ --denied other symtoms. will have vital sign to share during ov.   History of Present Illness: HTN: Noncompliant with lisinopril/HCTZ since 11/2018. Reports she had developed lip swelling and myalgia while taking medication. She did not inform any of her providers about side effects. Reports intermittent BP 170s/100s for last 2week. Developed dizziness while at work yesterday and was sent home. In 2018 she was evaluated by cardiology due to chest pain and SOB, echocardiogram and stress test were normal. Home sleep study was recommended but never completed.  BP Readings from Last 3 Encounters:  02/25/19 (!) 177/100  02/11/19 (!) 147/96  07/03/18 (!) 142/80   Anxiety: Improved with use of buspar BID prn  Postmenopausal Hot flashes: worse since total hysterectomy  Syphillis: Diagnosed 2019. 1dose of penicillin administered. She did not f/up for repeat injection or repeat test with ID  Review of Systems  Constitutional: Negative.   Eyes: Negative.   Respiratory: Negative.   Cardiovascular: Negative.   Musculoskeletal: Negative.   Neurological: Positive for dizziness and headaches. Negative for tingling, sensory change, focal weakness, seizures, loss of consciousness and weakness.  Psychiatric/Behavioral: Negative for depression. The patient is nervous/anxious. The patient does not  have insomnia.    Observations/Objective: Physical Exam  Constitutional: She is oriented to person, place, and time.  Eyes: Conjunctivae and EOM are normal.  Neck: Normal range of motion. Neck supple.  Cardiovascular: Normal rate.  Pulmonary/Chest: Effort normal.  Neurological: She is alert and oriented to person, place, and time.  Psychiatric: She has a normal mood and affect. Her behavior is normal.  Vitals reviewed.  Assessment and Plan: Kimberly Snow was seen today for follow-up.  Diagnoses and all orders for this visit:  Essential hypertension -     amLODipine (NORVASC) 10 MG tablet; Take 1 tablet (10 mg total) by mouth every evening. -     TSH; Future -     Comprehensive metabolic panel; Future  Dyslipidemia -     Lipid panel; Future -     Comprehensive metabolic panel; Future  Syphilis -     RPR; Future -     HIV Antibody (routine testing w rflx); Future  Menopausal vasomotor syndrome -     PARoxetine (PAXIL) 10 MG tablet; Take 1 tablet (10 mg total) by mouth daily.  Panic attacks -     busPIRone (BUSPAR) 7.5 MG tablet; Take 1 tablet (7.5 mg total) by mouth 3 (three) times daily as needed.   Follow Up Instructions: Stop lisinopril/HCTZ Start amlodipine today. Go to lab for blood draw. F/up in 1week (video call)   I discussed the assessment and treatment plan with the patient. The patient was provided an opportunity to ask questions and all were answered. The patient agreed with the plan and demonstrated an understanding of the instructions.   The patient was advised to call back or seek an in-person evaluation if the symptoms worsen or if the  condition fails to improve as anticipated.   Wilfred Lacy, NP

## 2019-02-25 NOTE — Assessment & Plan Note (Signed)
Improved with buspar BID prn Added paxil due to postmenopausal vasomotor symptoms

## 2019-02-26 ENCOUNTER — Other Ambulatory Visit (INDEPENDENT_AMBULATORY_CARE_PROVIDER_SITE_OTHER): Payer: 59

## 2019-02-26 DIAGNOSIS — A539 Syphilis, unspecified: Secondary | ICD-10-CM | POA: Diagnosis not present

## 2019-02-26 DIAGNOSIS — E785 Hyperlipidemia, unspecified: Secondary | ICD-10-CM

## 2019-02-26 DIAGNOSIS — I10 Essential (primary) hypertension: Secondary | ICD-10-CM

## 2019-03-01 ENCOUNTER — Telehealth: Payer: Self-pay

## 2019-03-01 LAB — LIPID PANEL
Cholesterol: 218 mg/dL — ABNORMAL HIGH (ref <200)
HDL: 49 mg/dL — ABNORMAL LOW (ref >=50)
LDL Cholesterol (Calc): 151 mg/dL (calc) — ABNORMAL HIGH
Non-HDL Cholesterol (Calc): 169 mg/dL (calc) — ABNORMAL HIGH (ref <130)
Total CHOL/HDL Ratio: 4.4 (calc) (ref <5.0)
Triglycerides: 79 mg/dL (ref <150)

## 2019-03-01 LAB — COMPREHENSIVE METABOLIC PANEL
AG Ratio: 1.3 (calc) (ref 1.0–2.5)
ALT: 24 U/L (ref 6–29)
AST: 28 U/L (ref 10–35)
Albumin: 4.3 g/dL (ref 3.6–5.1)
Alkaline phosphatase (APISO): 74 U/L (ref 37–153)
BUN: 16 mg/dL (ref 7–25)
CO2: 26 mmol/L (ref 20–32)
Calcium: 9.9 mg/dL (ref 8.6–10.4)
Chloride: 105 mmol/L (ref 98–110)
Creat: 0.88 mg/dL (ref 0.50–1.05)
Globulin: 3.2 g/dL (calc) (ref 1.9–3.7)
Glucose, Bld: 104 mg/dL — ABNORMAL HIGH (ref 65–99)
Potassium: 3.4 mmol/L — ABNORMAL LOW (ref 3.5–5.3)
Sodium: 143 mmol/L (ref 135–146)
Total Bilirubin: 0.5 mg/dL (ref 0.2–1.2)
Total Protein: 7.5 g/dL (ref 6.1–8.1)

## 2019-03-01 LAB — HIV ANTIBODY (ROUTINE TESTING W REFLEX): HIV 1&2 Ab, 4th Generation: NONREACTIVE

## 2019-03-01 LAB — TSH: TSH: 1.15 mIU/L

## 2019-03-01 LAB — RPR: RPR Ser Ql: NONREACTIVE

## 2019-03-01 NOTE — Telephone Encounter (Signed)
Copied from [place] [id number]. Topic: General - Other >> Mar 01, 2019 10:09 AM Leward Quan A wrote: Reason for CRM: Patient called to say that she forgot to get her work excuse note where Baldo Ash too her out of work for last Thursday and Friday and would like to pick it up today. Asking for a call back Ph# [phone]

## 2019-03-01 NOTE — Telephone Encounter (Signed)
Note placed up front for the pt to pick up. She is aware.

## 2019-03-01 NOTE — Telephone Encounter (Signed)
done

## 2019-03-01 NOTE — Telephone Encounter (Signed)
Pt needs a note for work. Pt was taken out of work by [place] on Friday.

## 2019-03-04 ENCOUNTER — Telehealth: Payer: Self-pay | Admitting: Nurse Practitioner

## 2019-03-04 NOTE — Telephone Encounter (Signed)
CN-Plz see note below/pt wants to wait until appointment with you on Friday and refused any sooner appt/thx dmf

## 2019-03-04 NOTE — Telephone Encounter (Signed)
Patient called and said that her work send her home yesterday because her BP was 190/110. She said that this morning it read 160/100. She thinks it has to do with her transferring to night shift. I offered her an appt but refuse, she would wait until her appt tomorrow with Baldo Ash. If any question to please call her back.

## 2019-03-05 ENCOUNTER — Ambulatory Visit (INDEPENDENT_AMBULATORY_CARE_PROVIDER_SITE_OTHER): Payer: 59 | Admitting: Nurse Practitioner

## 2019-03-05 ENCOUNTER — Encounter: Payer: Self-pay | Admitting: Nurse Practitioner

## 2019-03-05 VITALS — BP 164/90 | HR 96 | Ht 69.0 in | Wt 232.4 lb

## 2019-03-05 DIAGNOSIS — I10 Essential (primary) hypertension: Secondary | ICD-10-CM

## 2019-03-05 MED ORDER — IRBESARTAN 75 MG PO TABS: 75.0000 mg | ORAL_TABLET | Freq: Every day | ORAL | 2 refills | Status: DC

## 2019-03-05 NOTE — Progress Notes (Signed)
Virtual Visit via Video Note  I connected with Kimberly Snow on 03/05/19 at  1:30 PM EDT by a video enabled telemedicine application and verified that I am speaking with the correct person using two identifiers.  Location: Patient: Home Provider: Office   I discussed the limitations of evaluation and management by telemedicine and the availability of in person appointments. The patient expressed understanding and agreed to proceed.  CC: 1 wk follow up--pt report note to go back to work per her employee.   History of Present Illness:  HTN: Compliant with amlodipine. waxing and waning BP (190s/100s-160s/90s) BP Readings from Last 3 Encounters:  03/05/19 (!) 164/90  02/25/19 (!) 177/100  02/11/19 (!) 147/96  no CP, no headache, resolved dizziness, no edema, no palpitation. Reports she plans to change work shift to dayshift in 2weeks.  Observations/Objective: Physical Exam  Constitutional: She is oriented to person, place, and time. No distress.  Pulmonary/Chest: Effort normal.  Neurological: She is alert and oriented to person, place, and time.  Psychiatric: She has a normal mood and affect. Her behavior is normal. Thought content normal.  Vitals reviewed.  Assessment and Plan: [name] was seen today for follow-up.  Diagnoses and all orders for this visit:  Essential hypertension -     irbesartan (AVAPRO) 75 MG tablet; Take 1 tablet (75 mg total) by mouth daily.   Follow Up Instructions: Continue current medications. Start irbesartan F/up in 34month (F2F appt)   I discussed the assessment and treatment plan with the patient. The patient was provided an opportunity to ask questions and all were answered. The patient agreed with the plan and demonstrated an understanding of the instructions.   The patient was advised to call back or seek an in-person evaluation if the symptoms worsen or if the condition fails to improve as anticipated.  Wilfred Lacy, NP

## 2019-04-02 ENCOUNTER — Other Ambulatory Visit: Payer: Self-pay

## 2019-04-02 ENCOUNTER — Ambulatory Visit (INDEPENDENT_AMBULATORY_CARE_PROVIDER_SITE_OTHER): Payer: 59 | Admitting: Nurse Practitioner

## 2019-04-02 ENCOUNTER — Encounter: Payer: Self-pay | Admitting: Nurse Practitioner

## 2019-04-02 VITALS — BP 164/78 | HR 87 | Temp 98.9°F | Ht 69.0 in | Wt 237.0 lb

## 2019-04-02 DIAGNOSIS — I10 Essential (primary) hypertension: Secondary | ICD-10-CM

## 2019-04-02 DIAGNOSIS — R739 Hyperglycemia, unspecified: Secondary | ICD-10-CM | POA: Diagnosis not present

## 2019-04-02 DIAGNOSIS — D500 Iron deficiency anemia secondary to blood loss (chronic): Secondary | ICD-10-CM

## 2019-04-02 DIAGNOSIS — E876 Hypokalemia: Secondary | ICD-10-CM

## 2019-04-02 LAB — IBC PANEL
Iron: 41 ug/dL — ABNORMAL LOW (ref 42–145)
Saturation Ratios: 10.6 % — ABNORMAL LOW (ref 20.0–50.0)
Transferrin: 277 mg/dL (ref 212.0–360.0)

## 2019-04-02 LAB — BASIC METABOLIC PANEL
BUN: 15 mg/dL (ref 6–23)
CO2: 30 mEq/L (ref 19–32)
Calcium: 8.7 mg/dL (ref 8.4–10.5)
Chloride: 103 mEq/L (ref 96–112)
Creatinine, Ser: 0.82 mg/dL (ref 0.40–1.20)
GFR: 88.38 mL/min (ref >60.00)
Glucose, Bld: 121 mg/dL — ABNORMAL HIGH (ref 70–99)
Potassium: 3 mEq/L — ABNORMAL LOW (ref 3.5–5.1)
Sodium: 142 mEq/L (ref 135–145)

## 2019-04-02 MED ORDER — FERROUS SULFATE 324 (65 FE) MG PO TBEC: 1.0000 | DELAYED_RELEASE_TABLET | Freq: Every day | ORAL | Status: DC

## 2019-04-02 MED ORDER — AMLODIPINE BESYLATE 10 MG PO TABS: 10.0000 mg | ORAL_TABLET | Freq: Every evening | ORAL | 1 refills | Status: DC

## 2019-04-02 MED ORDER — POTASSIUM CHLORIDE CRYS ER 20 MEQ PO TBCR: 20.0000 meq | EXTENDED_RELEASE_TABLET | Freq: Every day | ORAL | 3 refills | Status: DC

## 2019-04-02 NOTE — Patient Instructions (Addendum)
Low potassium. Supplement sent. Persistent elevated glucose which indicates prediabetes. Need to maintain low carb and low fat diet. persistent iron deficiency. Resume ferrous sulfate 324mg  1tab daily OTC with food  Continue to monitor BP daily. Will need to start irbesartan if BP persistently >140/80.  Hypertension, Adult Hypertension is another name for high blood pressure. High blood pressure forces your heart to work harder to pump blood. This can cause problems over time. There are two numbers in a blood pressure reading. There is a top number (systolic) over a bottom number (diastolic). It is best to have a blood pressure that is below 120/80. Healthy choices can help lower your blood pressure, or you may need medicine to help lower it. What are the causes? The cause of this condition is not known. Some conditions may be related to high blood pressure. What increases the risk?  Smoking.  Having type 2 diabetes mellitus, high cholesterol, or both.  Not getting enough exercise or physical activity.  Being overweight.  Having too much fat, sugar, calories, or salt (sodium) in your diet.  Drinking too much alcohol.  Having long-term (chronic) kidney disease.  Having a family history of high blood pressure.  Age. Risk increases with age.  Race. You may be at higher risk if you are African American.  Gender. Men are at higher risk than women before age 41. After age 21, women are at higher risk than men.  Having obstructive sleep apnea.  Stress. What are the signs or symptoms?  High blood pressure may not cause symptoms. Very high blood pressure (hypertensive crisis) may cause: ? Headache. ? Feelings of worry or nervousness (anxiety). ? Shortness of breath. ? Nosebleed. ? A feeling of being sick to your stomach (nausea). ? Throwing up (vomiting). ? Changes in how you see. ? Very bad chest pain. ? Seizures. How is this treated?  This condition is treated by making  healthy lifestyle changes, such as: ? Eating healthy foods. ? Exercising more. ? Drinking less alcohol.  Your health care provider may prescribe medicine if lifestyle changes are not enough to get your blood pressure under control, and if: ? Your top number is above 130. ? Your bottom number is above 80.  Your personal target blood pressure may vary. Follow these instructions at home: Eating and drinking   If told, follow the DASH eating plan. To follow this plan: ? Fill one half of your plate at each meal with fruits and vegetables. ? Fill one fourth of your plate at each meal with whole grains. Whole grains include whole-wheat pasta, brown rice, and whole-grain bread. ? Eat or drink low-fat dairy products, such as skim milk or low-fat yogurt. ? Fill one fourth of your plate at each meal with low-fat (lean) proteins. Low-fat proteins include fish, chicken without skin, eggs, beans, and tofu. ? Avoid fatty meat, cured and processed meat, or chicken with skin. ? Avoid pre-made or processed food.  Eat less than 1,500 mg of salt each day.  Do not drink alcohol if: ? Your doctor tells you not to drink. ? You are pregnant, may be pregnant, or are planning to become pregnant.  If you drink alcohol: ? Limit how much you use to:  0-1 drink a day for women.  0-2 drinks a day for men. ? Be aware of how much alcohol is in your drink. In the [place] one drink equals one 12 oz bottle of beer (355 mL), one 5 oz glass of wine (148  mL), or one 1 oz glass of hard liquor (44 mL). Lifestyle   Work with your doctor to stay at a healthy weight or to lose weight. Ask your doctor what the best weight is for you.  Get at least 30 minutes of exercise most days of the week. This may include walking, swimming, or biking.  Get at least 30 minutes of exercise that strengthens your muscles (resistance exercise) at least 3 days a week. This may include lifting weights or doing Pilates.  Do not use any  products that contain nicotine or tobacco, such as cigarettes, e-cigarettes, and chewing tobacco. If you need help quitting, ask your doctor.  Check your blood pressure at home as told by your doctor.  Keep all follow-up visits as told by your doctor. This is important. Medicines  Take over-the-counter and prescription medicines only as told by your doctor. Follow directions carefully.  Do not skip doses of blood pressure medicine. The medicine does not work as well if you skip doses. Skipping doses also puts you at risk for problems.  Ask your doctor about side effects or reactions to medicines that you should watch for. Contact a doctor if you:  Think you are having a reaction to the medicine you are taking.  Have headaches that keep coming back (recurring).  Feel dizzy.  Have swelling in your ankles.  Have trouble with your vision. Get help right away if you:  Get a very bad headache.  Start to feel mixed up (confused).  Feel weak or numb.  Feel faint.  Have very bad pain in your: ? Chest. ? Belly (abdomen).  Throw up more than once.  Have trouble breathing. Summary  Hypertension is another name for high blood pressure.  High blood pressure forces your heart to work harder to pump blood.  For most people, a normal blood pressure is less than 120/80.  Making healthy choices can help lower blood pressure. If your blood pressure does not get lower with healthy choices, you may need to take medicine. This information is not intended to replace advice given to you by your health care provider. Make sure you discuss any questions you have with your health care provider. Document Released: 02/19/2008 Document Revised: 05/13/2018 Document Reviewed: 05/13/2018 Elsevier Patient Education  2020 [employer].

## 2019-04-02 NOTE — Addendum Note (Signed)
Addended by: Leana Gamer on: 04/02/2019 06:35 PM   Modules accepted: Orders, Level of Service

## 2019-04-02 NOTE — Assessment & Plan Note (Signed)
Negative syphills, so successful treatment. 02/26/2019

## 2019-04-02 NOTE — Progress Notes (Addendum)
Subjective:  Patient ID: Kimberly Snow, female    DOB: 07-04-1966  Age: 53 y.o. MRN: [id number]  CC: Follow-up (HTN, anxiety, and check IBC (repeat PHQ and GAD)/pt report BP at home is about 142/87--job changes recently--feel alot better. )  HPI  HTN: Elevated BP Declined to take irbesartan at this time. Taking amlodipine only. BP Readings from Last 3 Encounters:  04/02/19 (!) 164/78  03/05/19 (!) 164/90  02/25/19 (!) 177/100   Anxiety: Improved with change in work schedule. Depression screen [facility] 2/9 04/02/2019 02/25/2019  Decreased Interest 0 1  Down, Depressed, Hopeless 0 2  PHQ - 2 Score 0 3  Altered sleeping 1 3  Tired, decreased energy 0 2  Change in appetite 0 3  Feeling bad or failure about yourself  0 0  Trouble concentrating 0 2  Moving slowly or fidgety/restless 0 0  Suicidal thoughts 0 0  PHQ-9 Score 1 13   GAD 7 : Generalized Anxiety Score 04/02/2019 02/25/2019  Nervous, Anxious, on Edge 1 0  Control/stop worrying 1 2  Worry too much - different things 0 1  Trouble relaxing 0 1  Restless 0 3  Easily annoyed or irritable 0 1  Afraid - awful might happen 0 0  Total GAD 7 Score 2 8  Anxiety Difficulty Not difficult at all -    Reviewed past Medical, Social and Family history today.  Outpatient Medications Prior to Visit  Medication Sig Dispense Refill  . MELATONIN PO Take by mouth.    Marland Kitchen amLODipine (NORVASC) 10 MG tablet Take 1 tablet (10 mg total) by mouth every evening. 30 tablet 5  . albuterol (PROVENTIL HFA;VENTOLIN HFA) 108 (90 Base) MCG/ACT inhaler Inhale into the lungs.    . irbesartan (AVAPRO) 75 MG tablet Take 1 tablet (75 mg total) by mouth daily. (Patient not taking: Reported on 04/02/2019) 30 tablet 2  . busPIRone (BUSPAR) 7.5 MG tablet Take 1 tablet (7.5 mg total) by mouth 3 (three) times daily as needed. (Patient not taking: Reported on 04/02/2019) 45 tablet 2  . PARoxetine (PAXIL) 10 MG tablet Take 1 tablet (10 mg total) by mouth daily.  (Patient not taking: Reported on 04/02/2019) 30 tablet 5   No facility-administered medications prior to visit.     ROS See HPI  Objective:  BP (!) 164/78   Pulse 87   Temp 98.9 F (37.2 C) (Oral)   Ht 5\' 9"  (1.753 m)   Wt 237 lb (107.5 kg)   LMP 07/30/2015   SpO2 100%   BMI 35.00 kg/m   BP Readings from Last 3 Encounters:  04/02/19 (!) 164/78  03/05/19 (!) 164/90  02/25/19 (!) 177/100    Wt Readings from Last 3 Encounters:  04/02/19 237 lb (107.5 kg)  03/05/19 232 lb 6.4 oz (105.4 kg)  02/11/19 234 lb (106.1 kg)    Physical Exam Vitals signs reviewed.  Neck:     Musculoskeletal: Normal range of motion and neck supple.     Thyroid: No thyroid mass, thyromegaly or thyroid tenderness.  Cardiovascular:     Rate and Rhythm: Normal rate and regular rhythm.     Pulses: Normal pulses.     Heart sounds: Gallop present. S4 sounds present.   Pulmonary:     Effort: Pulmonary effort is normal.     Breath sounds: Normal breath sounds.  Musculoskeletal:     Right lower leg: No edema.     Left lower leg: No edema.  Neurological:  Mental Status: She is alert and oriented to person, place, and time.  Psychiatric:        Mood and Affect: Mood normal.        Behavior: Behavior normal.        Thought Content: Thought content normal.     Lab Results  Component Value Date   WBC 7.6 08/05/2017   HGB 12.4 08/05/2017   HCT 38.6 08/05/2017   PLT 298.0 08/05/2017   GLUCOSE 121 (H) 04/02/2019   CHOL 218 (H) 02/26/2019   TRIG 79 02/26/2019   HDL 49 (L) 02/26/2019   LDLCALC 151 (H) 02/26/2019   ALT 24 02/26/2019   AST 28 02/26/2019   NA 142 04/02/2019   K 3.0 (L) 04/02/2019   CL 103 04/02/2019   CREATININE 0.82 04/02/2019   BUN 15 04/02/2019   CO2 30 04/02/2019   TSH 1.15 02/26/2019   HGBA1C 5.3 05/22/2016    Mm Diag Breast Tomo Bilateral  Result Date: 07/14/2018 CLINICAL DATA:  53 year old female recalled from outside screening mammogram dated 06/01/2018 for a  possible left breast asymmetry and right breast motion artifact. EXAM: DIGITAL DIAGNOSTIC BILATERAL MAMMOGRAM WITH CAD AND TOMO COMPARISON:  Previous exam(s). ACR Breast Density Category c: The breast tissue is heterogeneously dense, which may obscure small masses. FINDINGS: Previously described, possible asymmetry in the central left breast does not persist on today's additional 3D views. No suspicious findings are identified. Technical repeat of the right MLO projection demonstrates no suspicious findings. Mammographic images were processed with CAD. IMPRESSION: No mammographic evidence of malignancy. RECOMMENDATION: Screening mammogram in one year.(Code:SM-B-01Y) I have discussed the findings and recommendations with the patient. Results were also provided in writing at the conclusion of the visit. If applicable, a reminder letter will be sent to the patient regarding the next appointment. BI-RADS CATEGORY  1: Negative. Electronically Signed   By: Kristopher Oppenheim M.D.   On: 07/14/2018 15:11    Assessment & Plan:   Aija was seen today for follow-up.  Diagnoses and all orders for this visit:  Essential hypertension -     Basic metabolic panel -     amLODipine (NORVASC) 10 MG tablet; Take 1 tablet (10 mg total) by mouth every evening.  Iron deficiency anemia due to chronic blood loss -     IBC panel -     ferrous sulfate 324 (65 Fe) MG TBEC; Take 1 tablet (325 mg total) by mouth daily.  Hypokalemia -     potassium chloride SA (K-DUR) 20 MEQ tablet; Take 1 tablet (20 mEq total) by mouth daily.  Hyperglycemia   I have discontinued Katura L. McKinzie's PARoxetine and busPIRone. I am also having her start on potassium chloride SA and ferrous sulfate. Additionally, I am having her maintain her albuterol, MELATONIN PO, irbesartan, and amLODipine.  Meds ordered this encounter  Medications  . amLODipine (NORVASC) 10 MG tablet    Sig: Take 1 tablet (10 mg total) by mouth every evening.     Dispense:  90 tablet    Refill:  1    Order Specific Question:   Supervising Provider    Answer:   MATTHEWS, CODY [[id number]]  . potassium chloride SA (K-DUR) 20 MEQ tablet    Sig: Take 1 tablet (20 mEq total) by mouth daily.    Dispense:  30 tablet    Refill:  3    Order Specific Question:   Supervising Provider    Answer:   MATTHEWS, CODY [[id number]]  .  ferrous sulfate 324 (65 Fe) MG TBEC    Sig: Take 1 tablet (325 mg total) by mouth daily.    Dispense:  30 tablet    Order Specific Question:   Supervising Provider    Answer:   Zigmund Daniel, CODY [[id number]]    Problem List Items Addressed This Visit      Cardiovascular and Mediastinum   Essential hypertension - Primary    Declined to take irbesartan. States hse want to continue working on relaxation, exercise and diet Takes only amlodipine at this time. No headache, no dizziness, no LE edema. Home BP readings 140s-150s/80s BP Readings from Last 3 Encounters:  04/02/19 (!) 164/78  03/05/19 (!) 164/90  02/25/19 (!) 177/100   She is aware of possible complications due to uncontrolled BP. F/up in 16months      Relevant Medications   amLODipine (NORVASC) 10 MG tablet   Other Relevant Orders   Basic metabolic panel (Completed)     Other   Anemia   Relevant Medications   ferrous sulfate 324 (65 Fe) MG TBEC   Other Relevant Orders   IBC panel (Completed)    Other Visit Diagnoses    Hypokalemia       Relevant Medications   potassium chloride SA (K-DUR) 20 MEQ tablet   Hyperglycemia           Follow-up: Return in about 3 months (around 07/03/2019) for CPE(fasting)-repeat cbc, ibc, A1c, lipid, cmp, pap.  Wilfred Lacy, NP

## 2019-04-02 NOTE — Assessment & Plan Note (Signed)
Declined to take irbesartan. States hse want to continue working on relaxation, exercise and diet Takes only amlodipine at this time. No headache, no dizziness, no LE edema. Home BP readings 140s-150s/80s BP Readings from Last 3 Encounters:  04/02/19 (!) 164/78  03/05/19 (!) 164/90  02/25/19 (!) 177/100   She is aware of possible complications due to uncontrolled BP. F/up in 68months

## 2019-06-11 ENCOUNTER — Encounter: Payer: Self-pay | Admitting: Family Medicine

## 2019-06-11 ENCOUNTER — Ambulatory Visit (INDEPENDENT_AMBULATORY_CARE_PROVIDER_SITE_OTHER): Payer: 59 | Admitting: Family Medicine

## 2019-06-11 ENCOUNTER — Other Ambulatory Visit: Payer: Self-pay

## 2019-06-11 VITALS — BP 160/90 | HR 98 | Ht 69.0 in | Wt 240.0 lb

## 2019-06-11 DIAGNOSIS — E876 Hypokalemia: Secondary | ICD-10-CM

## 2019-06-11 DIAGNOSIS — I10 Essential (primary) hypertension: Secondary | ICD-10-CM | POA: Diagnosis not present

## 2019-06-11 LAB — BASIC METABOLIC PANEL
BUN: 11 mg/dL (ref 6–23)
CO2: 30 mEq/L (ref 19–32)
Calcium: 9.1 mg/dL (ref 8.4–10.5)
Chloride: 101 mEq/L (ref 96–112)
Creatinine, Ser: 0.86 mg/dL (ref 0.40–1.20)
GFR: 83.59 mL/min (ref >60.00)
Glucose, Bld: 98 mg/dL (ref 70–99)
Potassium: 3.1 mEq/L — ABNORMAL LOW (ref 3.5–5.1)
Sodium: 140 mEq/L (ref 135–145)

## 2019-06-11 MED ORDER — SPIRONOLACTONE 25 MG PO TABS: 25.0000 mg | ORAL_TABLET | Freq: Every day | ORAL | 1 refills | Status: DC

## 2019-06-11 NOTE — Progress Notes (Signed)
Established Patient Office Visit  Subjective:  Patient ID: Kimberly Snow, female    DOB: [birth year]  Age: 54 y.o. MRN: [id number]  CC:  Chief Complaint  Patient presents with  . Hypertension    HPI Kimberly Snow presents for follow-up of her blood pressure.  Pressure has been elevated over the last several months.  Patient has been taking amlodipine 10 mg for 2 years without issue.  More recently her job is been stressful.  She does not smoke drink alcohol or use illicit drugs.  She has been consuming more fast food.  Hypokalemia is being treated with potassium.  [facility]  She had developed tingling in the right side of her face yesterday that has since resolved.  She also had experienced dizziness described as a spinning sensation.  She denies headache, dysphasia, unilateral weakness or tingling.  She had been out of work for couple weeks due to a knee and ankle injury and had taken ibuprofen up into this past Friday.  Past Medical History:  Diagnosis Date  . Abnormal vaginal bleeding   . Anemia   . Anxiety   . Blockage of coronary artery of heart (HCC)    was supposed to have a stent placed, but left hospital  . Fibroids   . Menorrhagia 05/12/2016    Past Surgical History:  Procedure Laterality Date  . ABDOMINAL HYSTERECTOMY  05/13/2017   ovaries still present.  Marland Kitchen HYSTERECTOMY ABDOMINAL WITH SALPINGECTOMY Bilateral 05/13/2016   Procedure: HYSTERECTOMY ABDOMINAL WITH SALPINGECTOMY with iud removal;  Surgeon: Eldred Manges, MD;  Location: [facility];  Service: Gynecology;  Laterality: Bilateral;  3 Hours  . TUBAL LIGATION    . WISDOM TOOTH EXTRACTION      Family History  Problem Relation Age of Onset  . Diabetes Mother   . Hypertension Mother   . Alcohol abuse Father   . Cancer Father        liver  . Hypertension Brother   . Heart disease Paternal Uncle   . Hypertension Paternal Uncle   . Hyperlipidemia Paternal  Uncle     Social History   Socioeconomic History  . Marital status: Legally Separated    Spouse name: Not on file  . Number of children: Not on file  . Years of education: Not on file  . Highest education level: Not on file  Occupational History  . Not on file  Social Needs  . Financial resource strain: Not on file  . Food insecurity    Worry: Not on file    Inability: Not on file  . Transportation needs    Medical: Not on file    Non-medical: Not on file  Tobacco Use  . Smoking status: Never Smoker  . Smokeless tobacco: Never Used  Substance and Sexual Activity  . Alcohol use: No  . Drug use: No  . Sexual activity: Yes    Birth control/protection: Surgical    Comment: ABD hysterectomy without ovaries  Lifestyle  . Physical activity    Days per week: Not on file    Minutes per session: Not on file  . Stress: Not on file  Relationships  . Social Herbalist on phone: Not on file    Gets together: Not on file    Attends religious service: Not on file    Active member of club or organization: Not on file    Attends meetings of clubs  or organizations: Not on file    Relationship status: Not on file  . Intimate partner violence    Fear of current or ex partner: Not on file    Emotionally abused: Not on file    Physically abused: Not on file    Forced sexual activity: Not on file  Other Topics Concern  . Not on file  Social History Narrative  . Not on file    Outpatient Medications Prior to Visit  Medication Sig Dispense Refill  . albuterol (PROVENTIL HFA;VENTOLIN HFA) 108 (90 Base) MCG/ACT inhaler Inhale into the lungs.    Marland Kitchen amLODipine (NORVASC) 10 MG tablet Take 1 tablet (10 mg total) by mouth every evening. 90 tablet 1  . ferrous sulfate 324 (65 Fe) MG TBEC Take 1 tablet (325 mg total) by mouth daily. 30 tablet   . irbesartan (AVAPRO) 75 MG tablet Take 1 tablet (75 mg total) by mouth daily. 30 tablet 2  . MELATONIN PO Take by mouth.    . potassium  chloride SA (K-DUR) 20 MEQ tablet Take 1 tablet (20 mEq total) by mouth daily. 30 tablet 3   No facility-administered medications prior to visit.     Allergies  Allergen Reactions  . Provera  [Medroxyprogesterone Acetate] Other (See Comments)  . Lisinopril Swelling    Lip swelling  . Oxycodone-Acetaminophen   . Medroxyprogesterone Anxiety    ROS Review of Systems  Constitutional: Negative for chills, diaphoresis, fatigue, fever and unexpected weight change.  HENT: Negative.   Eyes: Negative for photophobia and visual disturbance.  Respiratory: Negative.   Cardiovascular: Negative.   Gastrointestinal: Negative.   Endocrine: Negative for polyphagia and polyuria.  Genitourinary: Negative.   Musculoskeletal: Negative for gait problem and joint swelling.  Allergic/Immunologic: Negative for immunocompromised state.  Neurological: Positive for dizziness. Negative for speech difficulty and headaches.  Hematological: Does not bruise/bleed easily.  Psychiatric/Behavioral: The patient is nervous/anxious.       Objective:    Physical Exam  Constitutional: She is oriented to person, place, and time. She appears well-developed and well-nourished. No distress.  HENT:  Head: Normocephalic and atraumatic.  Right Ear: External ear normal.  Left Ear: External ear normal.  Mouth/Throat: Oropharynx is clear and moist. No oropharyngeal exudate.  Eyes: Pupils are equal, round, and reactive to light. Conjunctivae are normal. Right eye exhibits no discharge. Left eye exhibits no discharge. No scleral icterus.  Neck: Neck supple. No JVD present. No tracheal deviation present. No thyromegaly present.  Cardiovascular: Normal rate, regular rhythm and normal heart sounds.  Pulses:      Carotid pulses are 2+ on the right side and 2+ on the left side. No carotid bruits.   Pulmonary/Chest: Effort normal and breath sounds normal. No stridor.  Lymphadenopathy:    She has no cervical adenopathy.   Neurological: She is alert and oriented to person, place, and time. No cranial nerve deficit.  Skin: Skin is warm and dry. She is not diaphoretic.  Psychiatric: She has a normal mood and affect. Her behavior is normal.    BP (!) 160/90   Pulse 98   Ht 5\' 9"  (1.753 m)   Wt 240 lb (108.9 kg)   LMP 07/30/2015   SpO2 100%   BMI 35.44 kg/m  Wt Readings from Last 3 Encounters:  06/11/19 240 lb (108.9 kg)  04/02/19 237 lb (107.5 kg)  03/05/19 232 lb 6.4 oz (105.4 kg)   BP Readings from Last 3 Encounters:  06/11/19 (!) 160/90  04/02/19 Marland Kitchen)  164/78  03/05/19 (!) 164/90   Guideline developer:  UpToDate (see UpToDate for funding source) Date Released: June 2014  Health Maintenance Due  Topic Date Due  . COLONOSCOPY  09/01/2016  . INFLUENZA VACCINE  04/17/2019    There are no preventive care reminders to display for this patient.  Lab Results  Component Value Date   TSH 1.15 02/26/2019   Lab Results  Component Value Date   WBC 7.6 08/05/2017   HGB 12.4 08/05/2017   HCT 38.6 08/05/2017   MCV 85.0 08/05/2017   PLT 298.0 08/05/2017   Lab Results  Component Value Date   NA 142 04/02/2019   K 3.0 (L) 04/02/2019   CO2 30 04/02/2019   GLUCOSE 121 (H) 04/02/2019   BUN 15 04/02/2019   CREATININE 0.82 04/02/2019   BILITOT 0.5 02/26/2019   ALKPHOS 57 08/05/2017   AST 28 02/26/2019   ALT 24 02/26/2019   PROT 7.5 02/26/2019   ALBUMIN 4.0 08/05/2017   CALCIUM 8.7 04/02/2019   ANIONGAP 6 05/13/2016   GFR 88.38 04/02/2019   Lab Results  Component Value Date   CHOL 218 (H) 02/26/2019   Lab Results  Component Value Date   HDL 49 (L) 02/26/2019   Lab Results  Component Value Date   LDLCALC 151 (H) 02/26/2019   Lab Results  Component Value Date   TRIG 79 02/26/2019   Lab Results  Component Value Date   CHOLHDL 4.4 02/26/2019   Lab Results  Component Value Date   HGBA1C 5.3 05/22/2016      Assessment & Plan:   Problem List Items Addressed This Visit       Cardiovascular and Mediastinum   Essential hypertension - Primary   Relevant Medications   spironolactone (ALDACTONE) 25 MG tablet    Other Visit Diagnoses    Hypokalemia       Relevant Medications   spironolactone (ALDACTONE) 25 MG tablet   Other Relevant Orders   Basic metabolic panel      Meds ordered this encounter  Medications  . spironolactone (ALDACTONE) 25 MG tablet    Sig: Take 1 tablet (25 mg total) by mouth daily.    Dispense:  30 tablet    Refill:  1    Follow-up: Return Needs follow up with Baldo Ash in the next few weeks..   Needs close fu with potassium supplementation.  Patient was given information on the DASH diet.

## 2019-06-11 NOTE — Patient Instructions (Signed)

## 2019-06-14 ENCOUNTER — Ambulatory Visit: Payer: Self-pay

## 2019-06-14 NOTE — Telephone Encounter (Signed)
  Answer Assessment - Initial Assessment Questions 1. SYMPTOM: "Are you having difficulty swallowing liquids, solids, or both?"     liquid 2. ONSET: "When did the swallowing problems begin?"      Not the issue 3. CAUSE: "What do you think is causing the problem?"     spironolactone 4. CHRONIC/RECURRENT: "Is this a new problem for you?"  If no, ask: "How long have you had this problem?" (e.g., days, weeks, months)     reactionto medication 5. OTHER SYMPTOMS: "Do you have any other symptoms?" (e.g., difficulty breathing, sore throat, swollen tongue, chest pain)     Breathing is tight 6. PREGNANCY: "Is there any chance you are pregnant?" "When was your last menstrual period?"     *No Answer*  Protocols used: SWALLOWING DIFFICULTY-A-AH

## 2019-06-14 NOTE — Telephone Encounter (Signed)
Patient called stating that she started Spironolactone on Friday.  She started to feel nauseated and vomited on Saturday.  She has a rash on her arms and hands that itches.  She states that her breathing is tight and she has just taken another does of the medication. Per symptoms patient was directed to go to the ER for evaluation of symptoms. No triage was completed. Patient verbalized understanding of all instructions. Note will be routed to office.

## 2019-06-17 NOTE — Telephone Encounter (Signed)
FYI

## 2019-06-23 ENCOUNTER — Telehealth: Payer: Self-pay | Admitting: Nurse Practitioner

## 2019-06-23 DIAGNOSIS — F410 Panic disorder [episodic paroxysmal anxiety] without agoraphobia: Secondary | ICD-10-CM

## 2019-06-23 NOTE — Telephone Encounter (Signed)
Patient calling and states that she is needing a refill on anxiety medication, but does not know the name of the medication. She attempted to spell the medication out for me; "lispernol". No medication on her medication list with this name. States that she does not have the bottle with her. States that she would like a call back from the office because "they can tell me what it is".

## 2019-06-24 NOTE — Telephone Encounter (Signed)
Last medication discussed and showed to be helpful was buspar. She reported daytime somnolence with vistaril. I recommend resuming buspar. Is she ok with that?

## 2019-06-24 NOTE — Telephone Encounter (Signed)
Kimberly Snow please advise, pt request refill for Hydroxyzine. Pt stated she is experience some anxiety again and was hopping to get this refill. Offer an appt with another providers to discuss this med today since it discontinue back in 02/2019 but pt wants to wait until Baldo Ash comes back tomorrow.

## 2019-06-24 NOTE — Telephone Encounter (Signed)
Pt agree to Buspar. Please send it in to [facility].

## 2019-06-25 MED ORDER — BUSPIRONE HCL 7.5 MG PO TABS: 7.5000 mg | ORAL_TABLET | Freq: Three times a day (TID) | ORAL | 1 refills | Status: DC | PRN

## 2019-06-25 NOTE — Telephone Encounter (Signed)
Schedule office or virtual appt if no improvement in 2weeks

## 2019-11-08 ENCOUNTER — Ambulatory Visit (HOSPITAL_COMMUNITY)
Admission: EM | Admit: 2019-11-08 | Discharge: 2019-11-08 | Disposition: A | Discharge status: Home / Self Care | Payer: 59 | Attending: Urgent Care | Admitting: Urgent Care

## 2019-11-08 ENCOUNTER — Encounter (HOSPITAL_COMMUNITY): Payer: Self-pay

## 2019-11-08 ENCOUNTER — Other Ambulatory Visit: Payer: Self-pay

## 2019-11-08 DIAGNOSIS — Z20822 Contact with and (suspected) exposure to covid-19: Secondary | ICD-10-CM | POA: Diagnosis not present

## 2019-11-08 DIAGNOSIS — R197 Diarrhea, unspecified: Secondary | ICD-10-CM | POA: Insufficient documentation

## 2019-11-08 DIAGNOSIS — B349 Viral infection, unspecified: Secondary | ICD-10-CM | POA: Diagnosis not present

## 2019-11-08 DIAGNOSIS — R52 Pain, unspecified: Secondary | ICD-10-CM

## 2019-11-08 DIAGNOSIS — R112 Nausea with vomiting, unspecified: Secondary | ICD-10-CM | POA: Insufficient documentation

## 2019-11-08 DIAGNOSIS — Z79899 Other long term (current) drug therapy: Secondary | ICD-10-CM | POA: Diagnosis not present

## 2019-11-08 DIAGNOSIS — Z888 Allergy status to other drugs, medicaments and biological substances status: Secondary | ICD-10-CM | POA: Insufficient documentation

## 2019-11-08 DIAGNOSIS — R6883 Chills (without fever): Secondary | ICD-10-CM

## 2019-11-08 DIAGNOSIS — R5381 Other malaise: Secondary | ICD-10-CM | POA: Diagnosis present

## 2019-11-08 MED ORDER — ONDANSETRON 8 MG PO TBDP: 8.0000 mg | ORAL_TABLET | Freq: Three times a day (TID) | ORAL | 0 refills | Status: DC | PRN

## 2019-11-08 NOTE — Discharge Instructions (Addendum)
Make sure you push fluids drinking mostly water but mix it with Gatorade.  Try to eat light meals including soups, broths and soft foods, fruits.  You may use Zofran for your nausea and vomiting once every 8 hours.  Imodium can help with diarrhea but use this carefully limiting it to 1-2 times per day only if you are having a lot of diarrhea. Use Tylenol for aches and pains, 500mg -650mg  once every 6 hours as needed.

## 2019-11-08 NOTE — ED Provider Notes (Signed)
[place]   MRN: [id number] DOB: 11-01-1965  Subjective:   Kimberly Snow is a 54 y.o. female presenting for 3-day history of acute onset moderate malaise.  Symptoms started with multiple episodes of diarrhea, nausea and vomiting.  The diarrhea improved but she continued to have nausea and vomiting.  Then she developed chills, body aches, throat pain.  She still has some nausea and mild vomiting.  She took NyQuil and was dozing off.  Denies fever, syncope, confusion, headache, chest pain, cough, shortness of breath.  She had one sick contact, her mother who had similar GI symptoms.  No current facility-administered medications for this encounter.  Current Outpatient Medications:  .  albuterol (PROVENTIL HFA;VENTOLIN HFA) 108 (90 Base) MCG/ACT inhaler, Inhale into the lungs., Disp: , Rfl:  .  amLODipine (NORVASC) 10 MG tablet, Take 1 tablet (10 mg total) by mouth every evening., Disp: 90 tablet, Rfl: 1 .  busPIRone (BUSPAR) 7.5 MG tablet, Take 1 tablet (7.5 mg total) by mouth 3 (three) times daily as needed., Disp: 90 tablet, Rfl: 1 .  ferrous sulfate 324 (65 Fe) MG TBEC, Take 1 tablet (325 mg total) by mouth daily., Disp: 30 tablet, Rfl:  .  irbesartan (AVAPRO) 75 MG tablet, Take 1 tablet (75 mg total) by mouth daily., Disp: 30 tablet, Rfl: 2 .  MELATONIN PO, Take by mouth., Disp: , Rfl:  .  potassium chloride SA (K-DUR) 20 MEQ tablet, Take 1 tablet (20 mEq total) by mouth daily., Disp: 30 tablet, Rfl: 3 .  spironolactone (ALDACTONE) 25 MG tablet, Take 1 tablet (25 mg total) by mouth daily., Disp: 30 tablet, Rfl: 1   Allergies  Allergen Reactions  . Provera  [Medroxyprogesterone Acetate] Other (See Comments)  . Lisinopril Swelling    Lip swelling  . Oxycodone-Acetaminophen   . Medroxyprogesterone Anxiety    Past Medical History:  Diagnosis Date  . Abnormal vaginal bleeding   . Anemia   . Anxiety   . Blockage of coronary artery of heart (HCC)    was supposed to  have a stent placed, but left hospital  . Fibroids   . Menorrhagia 05/12/2016     Past Surgical History:  Procedure Laterality Date  . ABDOMINAL HYSTERECTOMY  05/13/2017   ovaries still present.  Marland Kitchen HYSTERECTOMY ABDOMINAL WITH SALPINGECTOMY Bilateral 05/13/2016   Procedure: HYSTERECTOMY ABDOMINAL WITH SALPINGECTOMY with iud removal;  Surgeon: Eldred Manges, MD;  Location: [facility];  Service: Gynecology;  Laterality: Bilateral;  3 Hours  . TUBAL LIGATION    . WISDOM TOOTH EXTRACTION      Family History  Problem Relation Age of Onset  . Diabetes Mother   . Hypertension Mother   . Alcohol abuse Father   . Cancer Father        liver  . Hypertension Brother   . Heart disease Paternal Uncle   . Hypertension Paternal Uncle   . Hyperlipidemia Paternal Uncle     Social History   Tobacco Use  . Smoking status: Never Smoker  . Smokeless tobacco: Never Used  Substance Use Topics  . Alcohol use: No  . Drug use: No    ROS   Objective:   Vitals: BP (!) 153/89 (BP Location: Left Arm)   Pulse 96   Temp 98.8 F (37.1 C)   Resp 16   LMP 07/30/2015   SpO2 98%   Physical Exam Constitutional:      General: She is not in acute distress.    Appearance:  Normal appearance. She is well-developed and normal weight. She is not ill-appearing, toxic-appearing or diaphoretic.  HENT:     Head: Normocephalic and atraumatic.     Right Ear: External ear normal.     Left Ear: External ear normal.     Nose: Nose normal.     Mouth/Throat:     Mouth: Mucous membranes are moist.     Pharynx: Oropharynx is clear. No oropharyngeal exudate or posterior oropharyngeal erythema.  Eyes:     General: No scleral icterus.    Extraocular Movements: Extraocular movements intact.     Pupils: Pupils are equal, round, and reactive to light.  Cardiovascular:     Rate and Rhythm: Normal rate and regular rhythm.     Pulses: Normal pulses.     Heart sounds: Normal heart sounds. No murmur. No friction rub.  No gallop.   Pulmonary:     Effort: Pulmonary effort is normal. No respiratory distress.     Breath sounds: Normal breath sounds. No stridor. No wheezing, rhonchi or rales.  Abdominal:     General: Bowel sounds are normal. There is no distension.     Palpations: Abdomen is soft. There is no mass.     Tenderness: There is no abdominal tenderness. There is no right CVA tenderness, left CVA tenderness, guarding or rebound.  Skin:    General: Skin is warm and dry.     Coloration: Skin is not pale.     Findings: No rash.  Neurological:     General: No focal deficit present.     Mental Status: She is alert and oriented to person, place, and time.  Psychiatric:        Mood and Affect: Mood normal.        Behavior: Behavior normal.        Thought Content: Thought content normal.        Judgment: Judgment normal.      Assessment and Plan :   1. Viral illness   2. Nausea vomiting and diarrhea   3. Body aches   4. Chills   5. Exposure to COVID-19 virus     COVID-19 testing pending.  Suspect viral gastroenteritis, recommended supportive care. Counseled patient on potential for adverse effects with medications prescribed/recommended today, ER and return-to-clinic precautions discussed, patient verbalized understanding.    Jaynee Eagles, [place] 11/08/19 1321

## 2019-11-08 NOTE — ED Triage Notes (Addendum)
Patient presents to Urgent Care with complaints of intermittent chills, fever, and "sinking deep into sleep, or going in and out of consciousness" since the past few days. Patient reports she has been having frequent BMs, urinated in her bed which is unusual for her. Pt states all her muscles hurt and it feels like she has been hit by a truck, has been vomiting as well. Pt was around someone else who had a stomach virus and she thinks somehow she got it from her.  Patient thinks she got COVID January of 2020 but was not ever diagnosed with it.

## 2019-11-10 LAB — NOVEL CORONAVIRUS, NAA (HOSP ORDER, SEND-OUT TO REF LAB; TAT 18-24 HRS): SARS-CoV-2, NAA: NOT DETECTED

## 2019-11-30 ENCOUNTER — Encounter: Payer: Self-pay | Admitting: Nurse Practitioner

## 2019-11-30 ENCOUNTER — Ambulatory Visit (INDEPENDENT_AMBULATORY_CARE_PROVIDER_SITE_OTHER): Payer: 59 | Admitting: Nurse Practitioner

## 2019-11-30 ENCOUNTER — Encounter: Payer: Self-pay | Admitting: Gastroenterology

## 2019-11-30 ENCOUNTER — Other Ambulatory Visit: Payer: Self-pay

## 2019-11-30 VITALS — BP 122/82 | HR 78 | Temp 97.2°F | Ht 69.0 in | Wt 247.2 lb

## 2019-11-30 DIAGNOSIS — Z0001 Encounter for general adult medical examination with abnormal findings: Secondary | ICD-10-CM

## 2019-11-30 DIAGNOSIS — E782 Mixed hyperlipidemia: Secondary | ICD-10-CM | POA: Diagnosis not present

## 2019-11-30 DIAGNOSIS — Z1211 Encounter for screening for malignant neoplasm of colon: Secondary | ICD-10-CM | POA: Diagnosis not present

## 2019-11-30 DIAGNOSIS — E876 Hypokalemia: Secondary | ICD-10-CM

## 2019-11-30 DIAGNOSIS — I10 Essential (primary) hypertension: Secondary | ICD-10-CM | POA: Diagnosis not present

## 2019-11-30 DIAGNOSIS — Z1231 Encounter for screening mammogram for malignant neoplasm of breast: Secondary | ICD-10-CM | POA: Diagnosis not present

## 2019-11-30 LAB — BASIC METABOLIC PANEL
BUN: 12 mg/dL (ref 6–23)
CO2: 35 mEq/L — ABNORMAL HIGH (ref 19–32)
Calcium: 9.3 mg/dL (ref 8.4–10.5)
Chloride: 97 mEq/L (ref 96–112)
Creatinine, Ser: 0.88 mg/dL (ref 0.40–1.20)
GFR: 81.26 mL/min (ref >60.00)
Glucose, Bld: 104 mg/dL — ABNORMAL HIGH (ref 70–99)
Potassium: 2.9 mEq/L — ABNORMAL LOW (ref 3.5–5.1)
Sodium: 137 mEq/L (ref 135–145)

## 2019-11-30 LAB — LIPID PANEL
Cholesterol: 190 mg/dL (ref 0–200)
HDL: 36.3 mg/dL — ABNORMAL LOW (ref >39.00)
LDL Cholesterol: 123 mg/dL — ABNORMAL HIGH (ref 0–99)
NonHDL: 153.41
Total CHOL/HDL Ratio: 5
Triglycerides: 153 mg/dL — ABNORMAL HIGH (ref 0.0–149.0)
VLDL: 30.6 mg/dL (ref 0.0–40.0)

## 2019-11-30 NOTE — Patient Instructions (Addendum)
Persistent low potassium. Increase potassium dose to 51mq Take 1tab BID x7days, then 1tab daily continuously. Abnormal lipid panel. Need to maintain DASH and regular exercise. You will be contacted to schedule appt for mammogram and with GI.  Maintain current medications for HTN: HCTZ and potassium.   Preventive Care [id number] Old, Female Preventive care refers to visits with your health care provider and lifestyle choices that can promote health and wellness. This includes:  A yearly physical exam. This may also be called an annual well check.  Regular dental visits and eye exams.  Immunizations.  Screening for certain conditions.  Healthy lifestyle choices, such as eating a healthy diet, getting regular exercise, not using drugs or products that contain nicotine and tobacco, and limiting alcohol use. What can I expect for my preventive care visit? Physical exam Your health care provider will check your:  Height and weight. This may be used to calculate body mass index (BMI), which tells if you are at a healthy weight.  Heart rate and blood pressure.  Skin for abnormal spots. Counseling Your health care provider may ask you questions about your:  Alcohol, tobacco, and drug use.  Emotional well-being.  Home and relationship well-being.  Sexual activity.  Eating habits.  Work and work eStatistician  Method of birth control.  Menstrual cycle.  Pregnancy history. What immunizations do I need?  Influenza (flu) vaccine  This is recommended every year. Tetanus, diphtheria, and pertussis (Tdap) vaccine  You may need a Td booster every 10 years. Varicella (chickenpox) vaccine  You may need this if you have not been vaccinated. Zoster (shingles) vaccine  You may need this after age 54 Measles, mumps, and rubella (MMR) vaccine  You may need at least one dose of MMR if you were born in 1957 or later. You may also need a second dose. Pneumococcal conjugate  (PCV13) vaccine  You may need this if you have certain conditions and were not previously vaccinated. Pneumococcal polysaccharide (PPSV23) vaccine  You may need one or two doses if you smoke cigarettes or if you have certain conditions. Meningococcal conjugate (MenACWY) vaccine  You may need this if you have certain conditions. Hepatitis A vaccine  You may need this if you have certain conditions or if you travel or work in places where you may be exposed to hepatitis A. Hepatitis B vaccine  You may need this if you have certain conditions or if you travel or work in places where you may be exposed to hepatitis B. Haemophilus influenzae type b (Hib) vaccine  You may need this if you have certain conditions. Human papillomavirus (HPV) vaccine  If recommended by your health care provider, you may need three doses over 6 months. You may receive vaccines as individual doses or as more than one vaccine together in one shot (combination vaccines). Talk with your health care provider about the risks and benefits of combination vaccines. What tests do I need? Blood tests  Lipid and cholesterol levels. These may be checked every 5 years, or more frequently if you are over 540years old.  Hepatitis C test.  Hepatitis B test. Screening  Lung cancer screening. You may have this screening every year starting at age 38154if you have a 30-pack-year history of smoking and currently smoke or have quit within the past 15 years.  Colorectal cancer screening. All adults should have this screening starting at age 3816and continuing until age 982 Your health care provider may recommend screening at age  45 if you are at increased risk. You will have tests every 1-10 years, depending on your results and the type of screening test.  Diabetes screening. This is done by checking your blood sugar (glucose) after you have not eaten for a while (fasting). You may have this done every 1-3 years.  Mammogram. This  may be done every 1-2 years. Talk with your health care provider about when you should start having regular mammograms. This may depend on whether you have a family history of breast cancer.  BRCA-related cancer screening. This may be done if you have a family history of breast, ovarian, tubal, or peritoneal cancers.  Pelvic exam and Pap test. This may be done every 3 years starting at age 58. Starting at age 50, this may be done every 5 years if you have a Pap test in combination with an HPV test. Other tests  Sexually transmitted disease (STD) testing.  Bone density scan. This is done to screen for osteoporosis. You may have this scan if you are at high risk for osteoporosis. Follow these instructions at home: Eating and drinking  Eat a diet that includes fresh fruits and vegetables, whole grains, lean protein, and low-fat dairy.  Take vitamin and mineral supplements as recommended by your health care provider.  Do not drink alcohol if: ? Your health care provider tells you not to drink. ? You are pregnant, may be pregnant, or are planning to become pregnant.  If you drink alcohol: ? Limit how much you have to 0-1 drink a day. ? Be aware of how much alcohol is in your drink. In the [place] one drink equals one 12 oz bottle of beer (355 mL), one 5 oz glass of wine (148 mL), or one 1 oz glass of hard liquor (44 mL). Lifestyle  Take daily care of your teeth and gums.  Stay active. Exercise for at least 30 minutes on 5 or more days each week.  Do not use any products that contain nicotine or tobacco, such as cigarettes, e-cigarettes, and chewing tobacco. If you need help quitting, ask your health care provider.  If you are sexually active, practice safe sex. Use a condom or other form of birth control (contraception) in order to prevent pregnancy and STIs (sexually transmitted infections).  If told by your health care provider, take low-dose aspirin daily starting at age 66. What's  next?  Visit your health care provider once a year for a well check visit.  Ask your health care provider how often you should have your eyes and teeth checked.  Stay up to date on all vaccines. This information is not intended to replace advice given to you by your health care provider. Make sure you discuss any questions you have with your health care provider. Document Revised: 05/14/2018 Document Reviewed: 05/14/2018 Elsevier Patient Education  2020 [employer].

## 2019-11-30 NOTE — Progress Notes (Signed)
Subjective:    Patient ID: Kimberly Snow, female    DOB: 1966-05-19, 54 y.o.   MRN: [id number]  Patient presents today for complete physical and eval of chronic conditions HPI  HTN: Reports recent ED visit due to uncontrolled BP. She had discontinued medications. She was discharged home with HCTZ and potassium. Improved BP since hospitalization. BP Readings from Last 3 Encounters:  11/30/19 122/82  11/08/19 (!) 153/89  06/11/19 (!) 160/90   Sexual History (orientation,birth control, marital status, STD):single, sexually active, s/p hysterectomy  Depression/Suicide: Depression screen [facility] 2/9 11/30/2019 04/02/2019 02/25/2019  Decreased Interest 0 0 1  Down, Depressed, Hopeless 0 0 2  PHQ - 2 Score 0 0 3  Altered sleeping - 1 3  Tired, decreased energy - 0 2  Change in appetite - 0 3  Feeling bad or failure about yourself  - 0 0  Trouble concentrating - 0 2  Moving slowly or fidgety/restless - 0 0  Suicidal thoughts - 0 0  PHQ-9 Score - 1 13   Vision:up to date  Dental:up to date  Immunizations: (TDAP, Hep C screen, Pneumovax, Influenza, zoster)  Health Maintenance  Topic Date Due  . Colon Cancer Screening  Never done  . Flu Shot  12/15/2019*  . Mammogram  07/14/2020  . Tetanus Vaccine  11/19/2026  . HIV Screening  Completed  . Pap Smear  Discontinued  *Topic was postponed. The date shown is not the original due date.   Diet:regular.  Weight:  Wt Readings from Last 3 Encounters:  11/30/19 247 lb 3.2 oz (112.1 kg)  06/11/19 240 lb (108.9 kg)  04/02/19 237 lb (107.5 kg)   Exercise:none  Fall Risk: Fall Risk  11/30/2019  Falls in the past year? 0  Number falls in past yr: 0  Injury with Fall? 0   Advanced Directive: Advanced Directives 06/07/2016  Does Patient Have a Medical Advance Directive? No  Would patient like information on creating a medical advance directive? No - patient declined information     Medications and allergies reviewed with  patient and updated if appropriate.  Patient Active Problem List   Diagnosis Date Noted  . History of syphilis 03/27/2018  . Hematuria 03/23/2018  . Pain in surgical scar 03/23/2018  . Somatic complaints, multiple 08/05/2017  . Mixed hyperlipidemia 11/18/2016  . Fatigue 11/18/2016  . Essential hypertension 11/07/2016  . Obesity (BMI 30.0-34.9) 11/07/2016  . Dyslipidemia 11/07/2016  . CAD- ?  11/07/2016  . Dyspareunia, female 10/09/2016  . S/P abdominal hysterectomy 05/22/2016  . Constipation 05/22/2016  . Panic attacks 05/22/2016  . Anemia 09/08/2015    Current Outpatient Medications on File Prior to Visit  Medication Sig Dispense Refill  . amLODipine (NORVASC) 10 MG tablet Take 1 tablet (10 mg total) by mouth every evening. 90 tablet 1  . ferrous sulfate 324 (65 Fe) MG TBEC Take 1 tablet (325 mg total) by mouth daily. 30 tablet   . hydrochlorothiazide (HYDRODIURIL) 25 MG tablet Take by mouth.    Marland Kitchen albuterol (PROVENTIL HFA;VENTOLIN HFA) 108 (90 Base) MCG/ACT inhaler Inhale into the lungs.    Marland Kitchen MELATONIN PO Take by mouth.     No current facility-administered medications on file prior to visit.    Past Medical History:  Diagnosis Date  . Abnormal vaginal bleeding   . Anemia   . Anxiety   . Blockage of coronary artery of heart (HCC)    was supposed to have a stent placed, but left hospital  .  Fibroids   . Menorrhagia 05/12/2016    Past Surgical History:  Procedure Laterality Date  . ABDOMINAL HYSTERECTOMY  05/13/2017   ovaries still present.  Marland Kitchen HYSTERECTOMY ABDOMINAL WITH SALPINGECTOMY Bilateral 05/13/2016   Procedure: HYSTERECTOMY ABDOMINAL WITH SALPINGECTOMY with iud removal;  Surgeon: Eldred Manges, MD;  Location: [facility];  Service: Gynecology;  Laterality: Bilateral;  3 Hours  . TUBAL LIGATION    . WISDOM TOOTH EXTRACTION      Social History   Socioeconomic History  . Marital status: Legally Separated    Spouse name: Not on file  . Number of children: Not  on file  . Years of education: Not on file  . Highest education level: Not on file  Occupational History  . Not on file  Tobacco Use  . Smoking status: Never Smoker  . Smokeless tobacco: Never Used  Substance and Sexual Activity  . Alcohol use: No  . Drug use: No  . Sexual activity: Yes    Birth control/protection: Surgical    Comment: ABD hysterectomy without ovaries  Other Topics Concern  . Not on file  Social History Narrative  . Not on file   Social Determinants of Health   Financial Resource Strain:   . Difficulty of Paying Living Expenses:   Food Insecurity:   . Worried About Charity fundraiser in the Last Year:   . Arboriculturist in the Last Year:   Transportation Needs:   . Film/video editor (Medical):   Marland Kitchen Lack of Transportation (Non-Medical):   Physical Activity:   . Days of Exercise per Week:   . Minutes of Exercise per Session:   Stress:   . Feeling of Stress :   Social Connections:   . Frequency of Communication with Friends and Family:   . Frequency of Social Gatherings with Friends and Family:   . Attends Religious Services:   . Active Member of Clubs or Organizations:   . Attends Archivist Meetings:   Marland Kitchen Marital Status:     Family History  Problem Relation Age of Onset  . Diabetes Mother   . Hypertension Mother   . Alcohol abuse Father   . Cancer Father        liver  . Hypertension Brother   . Heart disease Paternal Uncle   . Hypertension Paternal Uncle   . Hyperlipidemia Paternal Uncle        Review of Systems  Constitutional: Negative for fever, malaise/fatigue and weight loss.  HENT: Negative for congestion and sore throat.   Eyes:       Negative for visual changes  Respiratory: Negative for cough and shortness of breath.   Cardiovascular: Negative for chest pain, palpitations and leg swelling.  Gastrointestinal: Negative for blood in stool, constipation, diarrhea and heartburn.  Genitourinary: Negative for dysuria,  frequency and urgency.  Musculoskeletal: Negative for falls, joint pain and myalgias.  Skin: Negative for rash.  Neurological: Negative for dizziness, sensory change and headaches.  Endo/Heme/Allergies: Does not bruise/bleed easily.  Psychiatric/Behavioral: Negative for depression, substance abuse and suicidal ideas. The patient is not nervous/anxious.    Objective:   Vitals:   11/30/19 1415  BP: 122/82  Pulse: 78  Temp: (!) 97.2 F (36.2 C)  SpO2: 100%    Body mass index is 36.51 kg/m.   Physical Examination:  Physical Exam Vitals reviewed. Exam conducted with a chaperone present.  Constitutional:      Appearance: She is obese.  HENT:  Right Ear: Tympanic membrane, ear canal and external ear normal.     Left Ear: Tympanic membrane, ear canal and external ear normal.  Eyes:     Extraocular Movements: Extraocular movements intact.     Conjunctiva/sclera: Conjunctivae normal.  Cardiovascular:     Rate and Rhythm: Normal rate and regular rhythm.     Pulses: Normal pulses.     Heart sounds: Normal heart sounds.  Pulmonary:     Effort: Pulmonary effort is normal.     Breath sounds: Normal breath sounds.  Chest:     Breasts:        Right: Normal.        Left: Normal.  Abdominal:     General: Bowel sounds are normal.     Palpations: Abdomen is soft.  Musculoskeletal:     Right lower leg: No edema.     Left lower leg: No edema.  Lymphadenopathy:     Upper Body:     Right upper body: No supraclavicular, axillary or pectoral adenopathy.     Left upper body: No supraclavicular, axillary or pectoral adenopathy.  Skin:    General: Skin is warm and dry.  Neurological:     Mental Status: She is alert and oriented to person, place, and time.    ASSESSMENT and PLAN: This visit occurred during the SARS-CoV-2 public health emergency.  Safety protocols were in place, including screening questions prior to the visit, additional usage of staff PPE, and extensive cleaning of  exam room while observing appropriate contact time as indicated for disinfecting solutions.   Kona was seen today for annual exam.  Diagnoses and all orders for this visit:  Encounter for preventative adult health care exam with abnormal findings  Mixed hyperlipidemia -     Lipid panel  Essential hypertension -     Basic metabolic panel  Colon cancer screening -     Ambulatory referral to Gastroenterology  Breast cancer screening by mammogram -     MM DIGITAL SCREENING BILATERAL; Future  Hypokalemia -     potassium chloride 20 MEQ TBCR; Take 1tab BID x7days, then 1tab daily continuously   Essential hypertension Improved with HCTZ and potassium BP Readings from Last 3 Encounters:  11/30/19 122/82  11/08/19 (!) 153/89  06/11/19 (!) 160/90   f/up in 60month  Mixed hyperlipidemia ASCVD risk of 5.5% compared to her peers at 1.5%. Advised about need for DASH diet and regular exercise. Repeat in 73months Lipid Panel     Component Value Date/Time   CHOL 190 11/30/2019 1435   TRIG 153.0 (H) 11/30/2019 1435   HDL 36.30 (L) 11/30/2019 1435   CHOLHDL 5 11/30/2019 1435   VLDL 30.6 11/30/2019 1435   LDLCALC 123 (H) 11/30/2019 1435   LDLCALC 151 (H) 02/26/2019 1036      Problem List Items Addressed This Visit      Cardiovascular and Mediastinum   Essential hypertension    Improved with HCTZ and potassium BP Readings from Last 3 Encounters:  11/30/19 122/82  11/08/19 (!) 153/89  06/11/19 (!) 160/90   f/up in 73month      Relevant Medications   hydrochlorothiazide (HYDRODIURIL) 25 MG tablet   Other Relevant Orders   Basic metabolic panel (Completed)     Other   Mixed hyperlipidemia    ASCVD risk of 5.5% compared to her peers at 1.5%. Advised about need for DASH diet and regular exercise. Repeat in 20months Lipid Panel     Component Value Date/Time  CHOL 190 11/30/2019 1435   TRIG 153.0 (H) 11/30/2019 1435   HDL 36.30 (L) 11/30/2019 1435   CHOLHDL 5 11/30/2019  1435   VLDL 30.6 11/30/2019 1435   LDLCALC 123 (H) 11/30/2019 1435   LDLCALC 151 (H) 02/26/2019 1036        Relevant Medications   hydrochlorothiazide (HYDRODIURIL) 25 MG tablet   Other Relevant Orders   Lipid panel (Completed)    Other Visit Diagnoses    Encounter for preventative adult health care exam with abnormal findings    -  Primary   Colon cancer screening       Relevant Orders   Ambulatory referral to Gastroenterology   Breast cancer screening by mammogram       Relevant Orders   MM DIGITAL SCREENING BILATERAL   Hypokalemia       Relevant Medications   potassium chloride 20 MEQ TBCR       Follow up: Return in about 4 weeks (around 12/28/2019) for HTN (video, 63mins).  Wilfred Lacy, NP

## 2019-12-01 MED ORDER — POTASSIUM CHLORIDE ER 20 MEQ PO TBCR: EXTENDED_RELEASE_TABLET | ORAL | 1 refills | Status: DC

## 2019-12-02 ENCOUNTER — Encounter: Payer: Self-pay | Admitting: Nurse Practitioner

## 2019-12-02 NOTE — Assessment & Plan Note (Signed)
ASCVD risk of 5.5% compared to her peers at 1.5%. Advised about need for DASH diet and regular exercise. Repeat in 19months Lipid Panel     Component Value Date/Time   CHOL 190 11/30/2019 1435   TRIG 153.0 (H) 11/30/2019 1435   HDL 36.30 (L) 11/30/2019 1435   CHOLHDL 5 11/30/2019 1435   VLDL 30.6 11/30/2019 1435   LDLCALC 123 (H) 11/30/2019 1435   LDLCALC 151 (H) 02/26/2019 1036

## 2019-12-02 NOTE — Assessment & Plan Note (Signed)
Improved with HCTZ and potassium BP Readings from Last 3 Encounters:  11/30/19 122/82  11/08/19 (!) 153/89  06/11/19 (!) 160/90   f/up in 25month

## 2019-12-27 ENCOUNTER — Telehealth: Payer: Self-pay | Admitting: Nurse Practitioner

## 2019-12-27 NOTE — Telephone Encounter (Signed)
Patient is calling a refill for for her anxiety medication sent to [facility] in [place] is [phone].

## 2019-12-29 NOTE — Telephone Encounter (Signed)
Left pt a voicemail to call back to see which medication she's referring to.

## 2019-12-29 NOTE — Telephone Encounter (Signed)
Charlotte please advise.  Pt calling to see about a prescription for Buspirone. Buspirone was discontinued 11/30/19 due to change in therapy but this patient has a My Chart video appointment with you tomorrow morning 12/30/19.

## 2019-12-30 ENCOUNTER — Encounter: Payer: Self-pay | Admitting: Nurse Practitioner

## 2019-12-30 ENCOUNTER — Telehealth (INDEPENDENT_AMBULATORY_CARE_PROVIDER_SITE_OTHER): Payer: 59 | Admitting: Nurse Practitioner

## 2019-12-30 ENCOUNTER — Other Ambulatory Visit: Payer: Self-pay

## 2019-12-30 VITALS — Ht 69.0 in | Wt 247.0 lb

## 2019-12-30 DIAGNOSIS — F410 Panic disorder [episodic paroxysmal anxiety] without agoraphobia: Secondary | ICD-10-CM | POA: Diagnosis not present

## 2019-12-30 DIAGNOSIS — F411 Generalized anxiety disorder: Secondary | ICD-10-CM

## 2019-12-30 DIAGNOSIS — E876 Hypokalemia: Secondary | ICD-10-CM | POA: Diagnosis not present

## 2019-12-30 DIAGNOSIS — I10 Essential (primary) hypertension: Secondary | ICD-10-CM

## 2019-12-30 MED ORDER — TRAZODONE HCL 50 MG PO TABS: 25.0000 mg | ORAL_TABLET | Freq: Every evening | ORAL | 2 refills | Status: DC | PRN

## 2019-12-30 MED ORDER — HYDROCHLOROTHIAZIDE 25 MG PO TABS: 25.0000 mg | ORAL_TABLET | Freq: Every day | ORAL | 0 refills | Status: DC

## 2019-12-30 MED ORDER — POTASSIUM CHLORIDE ER 20 MEQ PO TBCR: 1.0000 | EXTENDED_RELEASE_TABLET | Freq: Every day | ORAL | 0 refills | Status: DC

## 2019-12-30 NOTE — Telephone Encounter (Signed)
Addressed during video appt today

## 2019-12-30 NOTE — Patient Instructions (Addendum)
Maintain HCTZ and KCl for HTN Start trazodone for insomnia due to anxiety. F/up in 2month in office If possible get upper arm cuff for BP check.

## 2019-12-30 NOTE — Assessment & Plan Note (Signed)
Chronic, waxing and waning, recent job loss and financial difficulty has led to increase anxiety. This interferes with sleep quality. She will like to resume buspar. Denies any SI or HI. Denies any ETOH use  Start trazodone at HS F/up in 59month

## 2019-12-30 NOTE — Progress Notes (Signed)
Virtual Visit via Video Note  I connected with@ on 12/30/19 at  8:00 AM EDT by a video enabled telemedicine application and verified that I am speaking with the correct person using two identifiers.  Location: Patient:Home Provider: Office Participants: patient and provider  I discussed the limitations of evaluation and management by telemedicine and the availability of in person appointments. I also discussed with the patient that there may be a patient responsible charge related to this service. The patient expressed understanding and agreed to proceed.  CC:HTN follow-pt said stable around 152-157/84-92//pt also asking about Buspirone for anxiety//pt at work no means for vitals.  History of Present Illness: HTN: Current use of HCTZ and potassium only She reports higher reading with wrist cuff. Denies any headache or dizziness or LE edema or palpitations or chest pain. BP Readings from Last 3 Encounters:  11/30/19 122/82  11/08/19 (!) 153/89  06/11/19 (!) 160/90   Anxiety: Chronic, waxing and waning, recent job loss and financial difficulty has led to increase anxiety. This interferes with sleep quality. She will like to resume buspar. Denies any SI or HI. Denies any ETOH use   Observations/Objective: Physical Exam  Constitutional: She is oriented to person, place, and time.  Pulmonary/Chest: Effort normal.  Neurological: She is alert and oriented to person, place, and time.  Psychiatric: Her speech is normal and behavior is normal. Thought content normal. Her mood appears anxious. Cognition and memory are normal.   Assessment and Plan: Salwa was seen today for follow-up.  Diagnoses and all orders for this visit:  GAD (generalized anxiety disorder) -     traZODone (DESYREL) 50 MG tablet; Take 0.5-1 tablets (25-50 mg total) by mouth at bedtime as needed for sleep.  Hypokalemia -     Potassium Chloride ER 20 MEQ TBCR; Take 1 tablet by mouth daily.  Essential  hypertension -     hydrochlorothiazide (HYDRODIURIL) 25 MG tablet; Take 1 tablet (25 mg total) by mouth daily.    Follow Up Instructions: See avs   I discussed the assessment and treatment plan with the patient. The patient was provided an opportunity to ask questions and all were answered. The patient agreed with the plan and demonstrated an understanding of the instructions.   The patient was advised to call back or seek an in-person evaluation if the symptoms worsen or if the condition fails to improve as anticipated.   Wilfred Lacy, NP

## 2019-12-30 NOTE — Assessment & Plan Note (Signed)
Current use of HCTZ and potassium only She reports higher reading with wrist cuff (152-157/84-92). Denies any headache or dizziness or LE edema or palpitations or chest pain.  Continue HCTZ and KCL Advised to get upper arm cuff if possible. F/up in 85month in office

## 2020-01-10 ENCOUNTER — Encounter: Payer: 59 | Admitting: Gastroenterology

## 2020-01-20 ENCOUNTER — Encounter: Payer: 59 | Admitting: Gastroenterology

## 2020-01-31 ENCOUNTER — Ambulatory Visit: Payer: 59 | Admitting: Nurse Practitioner

## 2020-04-05 ENCOUNTER — Other Ambulatory Visit: Payer: Self-pay | Admitting: Nurse Practitioner

## 2020-04-05 DIAGNOSIS — I10 Essential (primary) hypertension: Secondary | ICD-10-CM

## 2020-04-05 MED ORDER — SPIRONOLACTONE 25 MG PO TABS: 25.0000 mg | ORAL_TABLET | Freq: Every day | ORAL | 0 refills | Status: DC

## 2020-04-05 NOTE — Telephone Encounter (Signed)
Left pt a voicemail to call office back and let us know if shes still taking this medication-she reported to Dr. Angela Cox on 03/17/20 she stopped taking this because it made her potassium to low.

## 2020-04-05 NOTE — Telephone Encounter (Signed)
Kimberly Snow pt called back and said she doesn't need this refill. Pt was very emotional she said she lost her job and when I asked how her BP was she said it was 190/91 and she was scared and worried and when I asked her if she wanted to make an appointment she said she can't afford it right now because she also lost her insurance.

## 2020-04-05 NOTE — Telephone Encounter (Signed)
With persistent low potassium, I recommend discontinuation of HCTZ. Continue amlodipine 10mg . She will probably need another antihypertensive agent.I sent spironolactone 25mg  in place of HCTZ, #30tabs. With lack of insurance and unable to afford self copay, I recommend she reach out to [facility] for continuous care before she runs out of medications.

## 2020-04-06 NOTE — Telephone Encounter (Signed)
Pt was contacted and notified, pt is going for an interview today an they offer immediate health insurance, she is hoping she can get on and continue her care with [place]. She has ObamaCare as of right now.

## 2020-04-21 ENCOUNTER — Ambulatory Visit: Payer: 59 | Admitting: Nurse Practitioner

## 2020-06-22 ENCOUNTER — Telehealth: Payer: Self-pay | Admitting: Nurse Practitioner

## 2020-06-22 DIAGNOSIS — R0600 Dyspnea, unspecified: Secondary | ICD-10-CM

## 2020-06-22 DIAGNOSIS — Z8616 Personal history of COVID-19: Secondary | ICD-10-CM

## 2020-06-22 NOTE — Telephone Encounter (Signed)
Referral entered  

## 2020-06-22 NOTE — Telephone Encounter (Signed)
Pt states she had COVID over a month ago and she has been having issues breathing and would like to know if she could get a referral to a pulmonologist. Pt states her breathing is worse at night, she states laying on her side helps some but not much.

## 2020-06-22 NOTE — Telephone Encounter (Addendum)
Patient is calling and requesting to speak to someone regarding getting a referral to see a Pulmonologist, please advise. CB is [phone]

## 2020-07-21 ENCOUNTER — Institutional Professional Consult (permissible substitution): Payer: 59 | Admitting: Pulmonary Disease

## 2020-08-27 NOTE — Progress Notes (Deleted)
08/28/20- 63 yoF never smoker for sleep evaluation courtesy of Wilfred Lacy, NP, with concern of paroxysmal nocturnal dyspnea. Problem list includess hx sleep apnea-resolved. Care Everywhere note mentions sleep disturbance due to anxiety after recent job loss and financial difficulty. Medical problem list includes CAD, HTN, Anemia, Dyslipidemia, Obesity,  Epworth score- Body weight today- Covid vax- Flu vax-

## 2020-08-28 ENCOUNTER — Institutional Professional Consult (permissible substitution): Payer: 59 | Admitting: Internal Medicine

## 2021-02-02 ENCOUNTER — Ambulatory Visit (HOSPITAL_COMMUNITY)
Admission: EM | Admit: 2021-02-02 | Discharge: 2021-02-02 | Disposition: A | Discharge status: Home / Self Care | Payer: BLUE CROSS/BLUE SHIELD

## 2021-02-02 ENCOUNTER — Other Ambulatory Visit: Payer: Self-pay

## 2021-02-02 ENCOUNTER — Encounter (HOSPITAL_COMMUNITY): Payer: Self-pay

## 2021-02-02 DIAGNOSIS — H1131 Conjunctival hemorrhage, right eye: Secondary | ICD-10-CM

## 2021-02-02 NOTE — ED Triage Notes (Signed)
Pt reports redness in the right aye and soreness x 3 das after vomiting.

## 2021-02-02 NOTE — ED Provider Notes (Signed)
[facility]    CSN: [id number] Arrival date & time: 02/02/21  1524      History   Chief Complaint Chief Complaint  Patient presents with  . Eye Problem    HPI Kimberly Snow is a 55 y.o. female.   HPI   Eye redness: Pt reports that she has had right eye redness for the past 3 days following an episode of vomiting. The eye is not painful or itchy but when she applied red eye drops they did give a burning sensation that has resolved. She reports no eye trauma or changes in vision. She reports that she needs a work note stating that she does not have pink eye.   Past Medical History:  Diagnosis Date  . Abnormal vaginal bleeding   . Anemia   . Anxiety   . Blockage of coronary artery of heart (HCC)    was supposed to have a stent placed, but left hospital  . Fibroids   . Menorrhagia 05/12/2016    Patient Active Problem List   Diagnosis Date Noted  . History of syphilis 03/27/2018  . Hematuria 03/23/2018  . Pain in surgical scar 03/23/2018  . Somatic complaints, multiple 08/05/2017  . Mixed hyperlipidemia 11/18/2016  . Fatigue 11/18/2016  . Essential hypertension 11/07/2016  . Obesity (BMI 30.0-34.9) 11/07/2016  . Dyslipidemia 11/07/2016  . CAD- ?  11/07/2016  . Dyspareunia, female 10/09/2016  . S/P abdominal hysterectomy 05/22/2016  . Constipation 05/22/2016  . Panic attacks 05/22/2016  . Anemia 09/08/2015    Past Surgical History:  Procedure Laterality Date  . ABDOMINAL HYSTERECTOMY  05/13/2017   ovaries still present.  Marland Kitchen HYSTERECTOMY ABDOMINAL WITH SALPINGECTOMY Bilateral 05/13/2016   Procedure: HYSTERECTOMY ABDOMINAL WITH SALPINGECTOMY with iud removal;  Surgeon: Eldred Manges, MD;  Location: [facility];  Service: Gynecology;  Laterality: Bilateral;  3 Hours  . TUBAL LIGATION    . WISDOM TOOTH EXTRACTION      OB History    Gravida  2   Para  1   Term  1   Preterm      AB  1   Living  1     SAB      IAB  1   Ectopic       Multiple      Live Births               Home Medications    Prior to Admission medications   Medication Sig Start Date End Date Taking? Authorizing Provider  albuterol (PROVENTIL HFA;VENTOLIN HFA) 108 (90 Base) MCG/ACT inhaler Inhale into the lungs. 03/23/18   [provider]  amLODipine (NORVASC) 10 MG tablet Take 1 tablet (10 mg total) by mouth every evening. 04/02/19   Nche, Charlene Brooke, NP  ferrous sulfate 324 (65 Fe) MG TBEC Take 1 tablet (325 mg total) by mouth daily. 04/02/19   Nche, Charlene Brooke, NP  fluticasone (FLONASE) 50 MCG/ACT nasal spray Place into the nose. 12/07/18   [provider]  hydrochlorothiazide (HYDRODIURIL) 25 MG tablet Take 1 tablet (25 mg total) by mouth daily. 12/30/19   Nche, Charlene Brooke, NP  loratadine (CLARITIN) 10 MG tablet Take by mouth. 12/07/18   [provider]  MELATONIN PO Take by mouth.    [provider]  spironolactone (ALDACTONE) 25 MG tablet Take 1 tablet (25 mg total) by mouth daily. 04/05/20   Nche, Charlene Brooke, NP  traZODone (DESYREL) 50 MG tablet Take 0.5-1 tablets (25-50 mg  total) by mouth at bedtime as needed for sleep. 12/30/19   Nche, Charlene Brooke, NP    Family History Family History  Problem Relation Age of Onset  . Diabetes Mother   . Hypertension Mother   . Alcohol abuse Father   . Cancer Father        liver  . Hypertension Brother   . Heart disease Paternal Uncle   . Hypertension Paternal Uncle   . Hyperlipidemia Paternal Uncle     Social History Social History   Tobacco Use  . Smoking status: Never Smoker  . Smokeless tobacco: Never Used  Vaping Use  . Vaping Use: Never used  Substance Use Topics  . Alcohol use: No  . Drug use: No     Allergies   Medroxyprogesterone, Provera  [medroxyprogesterone acetate], Codeine, Lisinopril, and Oxycodone-acetaminophen   Review of Systems Review of Systems  As stated above in HPI Physical Exam Triage Vital Signs ED Triage  Vitals [02/02/21 1552]  Enc Vitals Group     BP 140/77     Pulse Rate 97     Resp 18     Temp 98.8 F (37.1 C)     Temp Source Oral     SpO2      Weight      Height      Head Circumference      Peak Flow      Pain Score      Pain Loc      Pain Edu?      Excl. in [place]?    No data found.  Updated Vital Signs BP 140/77 (BP Location: Right Arm)   Pulse 97   Temp 98.8 F (37.1 C) (Oral)   Resp 18   LMP 07/30/2015   Visual Acuity Right Eye Distance: 20/25 (Without correction ) Left Eye Distance: 20/30 (Without correction ) Bilateral Distance: 20/25 (Without correction )  Right Eye Near:   Left Eye Near:    Bilateral Near:     Physical Exam Vitals and nursing note reviewed.  Constitutional:      General: She is not in acute distress.    Appearance: Normal appearance. She is not ill-appearing, toxic-appearing or diaphoretic.  HENT:     Head: Normocephalic and atraumatic.  Eyes:     General: Lids are normal. Lids are everted, no foreign bodies appreciated. Vision grossly intact. Gaze aligned appropriately.        Right eye: No foreign body, discharge or hordeolum.        Left eye: No foreign body, discharge or hordeolum.     Extraocular Movements: Extraocular movements intact.     Right eye: Normal extraocular motion.     Left eye: Normal extraocular motion.     Conjunctiva/sclera:     Right eye: Hemorrhage present. No exudate.    Left eye: No exudate or hemorrhage.  Neurological:     Mental Status: She is alert.      UC Treatments / Results  Labs (all labs ordered are listed, but only abnormal results are displayed) Labs Reviewed - No data to display  EKG   Radiology No results found.  Procedures Procedures (including critical care time)  Medications Ordered in UC Medications - No data to display  Initial Impression / Assessment and Plan / UC Course  I have reviewed the triage vital signs and the nursing notes.  Pertinent labs & imaging results  that were available during my care of the patient were reviewed by  me and considered in my medical decision making (see chart for details).     New.  Discussed that a subconjunctival hemorrhage is self-limited and resolves within a few weeks. Discussed red flag signs and symptoms.  Final Clinical Impressions(s) / UC Diagnoses   Final diagnoses:  Conjunctival hemorrhage of right eye   Discharge Instructions   None    ED Prescriptions    None     PDMP not reviewed this encounter.   Hughie Closs, [place] 02/02/21 1616

## 2021-05-14 DIAGNOSIS — J4520 Mild intermittent asthma, uncomplicated: Secondary | ICD-10-CM | POA: Insufficient documentation

## 2021-06-14 ENCOUNTER — Ambulatory Visit: Payer: 59 | Admitting: Nurse Practitioner

## 2021-10-01 DIAGNOSIS — R200 Anesthesia of skin: Secondary | ICD-10-CM | POA: Insufficient documentation

## 2021-11-01 DIAGNOSIS — R7303 Prediabetes: Secondary | ICD-10-CM | POA: Insufficient documentation

## 2022-05-13 DIAGNOSIS — M79604 Pain in right leg: Secondary | ICD-10-CM | POA: Insufficient documentation

## 2022-07-29 ENCOUNTER — Ambulatory Visit: Payer: Managed Care, Other (non HMO) | Admitting: Nurse Practitioner

## 2022-10-15 ENCOUNTER — Ambulatory Visit: Payer: Commercial Managed Care - PPO | Admitting: Nurse Practitioner

## 2022-10-15 ENCOUNTER — Encounter: Payer: Self-pay | Admitting: Nurse Practitioner

## 2022-10-15 ENCOUNTER — Telehealth: Payer: Self-pay | Admitting: Nurse Practitioner

## 2022-10-15 VITALS — BP 150/82 | HR 85 | Temp 97.2°F | Ht 69.0 in | Wt 273.8 lb

## 2022-10-15 DIAGNOSIS — R7303 Prediabetes: Secondary | ICD-10-CM

## 2022-10-15 DIAGNOSIS — M791 Myalgia, unspecified site: Secondary | ICD-10-CM | POA: Insufficient documentation

## 2022-10-15 DIAGNOSIS — I10 Essential (primary) hypertension: Secondary | ICD-10-CM | POA: Diagnosis not present

## 2022-10-15 DIAGNOSIS — R350 Frequency of micturition: Secondary | ICD-10-CM

## 2022-10-15 DIAGNOSIS — N946 Dysmenorrhea, unspecified: Secondary | ICD-10-CM | POA: Insufficient documentation

## 2022-10-15 DIAGNOSIS — E876 Hypokalemia: Secondary | ICD-10-CM

## 2022-10-15 LAB — RENAL FUNCTION PANEL
Albumin: 4 g/dL (ref 3.5–5.2)
BUN: 10 mg/dL (ref 6–23)
CO2: 30 mEq/L (ref 19–32)
Calcium: 8.7 mg/dL (ref 8.4–10.5)
Chloride: 103 mEq/L (ref 96–112)
Creatinine, Ser: 0.71 mg/dL (ref 0.40–1.20)
GFR: 95.25 mL/min (ref >60.00)
Glucose, Bld: 80 mg/dL (ref 70–99)
Phosphorus: 2.7 mg/dL (ref 2.3–4.6)
Potassium: 3.4 mEq/L — ABNORMAL LOW (ref 3.5–5.1)
Sodium: 139 mEq/L (ref 135–145)

## 2022-10-15 LAB — SEDIMENTATION RATE: Sed Rate: 25 mm/hr (ref 0–30)

## 2022-10-15 LAB — CK: Total CK: 117 U/L (ref 7–177)

## 2022-10-15 LAB — C-REACTIVE PROTEIN: CRP: <1 mg/dL (ref 0.5–20.0)

## 2022-10-15 LAB — TSH: TSH: 1.55 u[IU]/mL (ref 0.35–5.50)

## 2022-10-15 LAB — HEMOGLOBIN A1C: Hgb A1c MFr Bld: 6 % (ref 4.6–6.5)

## 2022-10-15 MED ORDER — OLMESARTAN MEDOXOMIL 40 MG PO TABS: 40.0000 mg | ORAL_TABLET | Freq: Every day | ORAL | 1 refills | Status: DC

## 2022-10-15 MED ORDER — MELOXICAM 7.5 MG PO TABS: 7.5000 mg | ORAL_TABLET | Freq: Every day | ORAL | 5 refills | Status: DC

## 2022-10-15 NOTE — Progress Notes (Unsigned)
Established Patient Visit  Patient: Kimberly Snow   DOB: [birth year]   57 y.o. Female  MRN: [id number] Visit Date: 10/21/2022  Subjective:    Chief Complaint  Patient presents with   Establish Care    Re establish care  C/o lower muscle pain, now radiates towards upper body x 5 months  Mammo order    HPI Essential (primary) hypertension Bp not at goal with olmesartan '20mg'$  BP Readings from Last 3 Encounters:  10/15/22 (!) 150/82  02/02/21 140/77  11/30/19 122/82    Check BMP Increase med dose to '40mg'$  Advised about the importance of DASH diet F/up in 26month Myalgia Generalized muscle and joint pain, Onset with use of crestor x 229month Worse at night She Stopped crestor 38m238monthgo Symptoms improved but have not resolved. Normal cbc 04/2022, completed by previous [facility] pcp  Check bmp, tsh, ck, esr, and crp: normal Use mobic 7.'5mg'$  for pain  Prediabetes Repeat hgbA1c  Reviewed medical, surgical, and social history today Social History   Socioeconomic History   Marital status: Legally Separated    Spouse name: Not on file   Number of children: Not on file   Years of education: Not on file   Highest education level: Not on file  Occupational History   Not on file  Tobacco Use   Smoking status: Never   Smokeless tobacco: Never  Vaping Use   Vaping Use: Never used  Substance and Sexual Activity   Alcohol use: No   Drug use: No   Sexual activity: Yes    Birth control/protection: Surgical    Comment: ABD hysterectomy without ovaries  Other Topics Concern   Not on file  Social History Narrative   Not on file   Social Determinants of Health   Financial Resource Strain: Not on file  Food Insecurity: Not on file  Transportation Needs: Not on file  Physical Activity: Not on file  Stress: Not on file  Social Connections: Not on file  Intimate Partner Violence: Not on file    Medications: Outpatient Medications Prior to  Visit  Medication Sig   albuterol (PROVENTIL HFA;VENTOLIN HFA) 108 (90 Base) MCG/ACT inhaler Inhale into the lungs.   [DISCONTINUED] loratadine (CLARITIN) 10 MG tablet Take by mouth.   [DISCONTINUED] olmesartan (BENICAR) 20 MG tablet Take 1 tablet by mouth daily.   hydrochlorothiazide (HYDRODIURIL) 25 MG tablet Take 1 tablet (25 mg total) by mouth daily. (Patient not taking: Reported on 10/15/2022)   [DISCONTINUED] amLODipine (NORVASC) 10 MG tablet Take 1 tablet (10 mg total) by mouth every evening. (Patient not taking: Reported on 10/15/2022)   [DISCONTINUED] ferrous sulfate 324 (65 Fe) MG TBEC Take 1 tablet (325 mg total) by mouth daily. (Patient not taking: Reported on 10/15/2022)   [DISCONTINUED] fluticasone (FLONASE) 50 MCG/ACT nasal spray Place into the nose. (Patient not taking: Reported on 10/15/2022)   [DISCONTINUED] MELATONIN PO Take by mouth. (Patient not taking: Reported on 10/15/2022)   [DISCONTINUED] spironolactone (ALDACTONE) 25 MG tablet Take 1 tablet (25 mg total) by mouth daily. (Patient not taking: Reported on 10/15/2022)   [DISCONTINUED] traZODone (DESYREL) 50 MG tablet Take 0.5-1 tablets (25-50 mg total) by mouth at bedtime as needed for sleep. (Patient not taking: Reported on 10/15/2022)   No facility-administered medications prior to visit.   Reviewed past medical and social history.   ROS per HPI above      Objective:  BP (!) 150/82   Pulse 85   Temp (!) 97.2 F (36.2 C) (Temporal)   Ht '5\' 9"'$  (1.753 m)   Wt 273 lb 12.8 oz (124.2 kg)   LMP 07/30/2015   SpO2 98%   BMI 40.43 kg/m      Physical Exam Vitals reviewed.  Cardiovascular:     Rate and Rhythm: Normal rate and regular rhythm.     Pulses: Normal pulses.     Heart sounds: Normal heart sounds.  Pulmonary:     Effort: Pulmonary effort is normal.     Breath sounds: Normal breath sounds.  Musculoskeletal:        General: No swelling. Normal range of motion.     Right lower leg: No edema.     Left lower  leg: No edema.  Skin:    General: Skin is warm and dry.     Findings: No rash.  Neurological:     Mental Status: She is alert and oriented to person, place, and time.  Psychiatric:        Mood and Affect: Mood normal.        Behavior: Behavior normal.        Thought Content: Thought content normal.     Results for orders placed or performed in visit on 10/15/22  Hemoglobin A1c  Result Value Ref Range   Hgb A1c MFr Bld 6.0 4.6 - 6.5 %  Renal Function Panel  Result Value Ref Range   Sodium 139 135 - 145 mEq/L   Potassium 3.4 (L) 3.5 - 5.1 mEq/L   Chloride 103 96 - 112 mEq/L   CO2 30 19 - 32 mEq/L   Albumin 4.0 3.5 - 5.2 g/dL   BUN 10 6 - 23 mg/dL   Creatinine, Ser 0.71 0.40 - 1.20 mg/dL   Glucose, Bld 80 70 - 99 mg/dL   Phosphorus 2.7 2.3 - 4.6 mg/dL   GFR 95.25 >60.00 mL/min   Calcium 8.7 8.4 - 10.5 mg/dL  TSH  Result Value Ref Range   TSH 1.55 0.35 - 5.50 uIU/mL  Sedimentation rate  Result Value Ref Range   Sed Rate 25 0 - 30 mm/hr  C-reactive protein  Result Value Ref Range   CRP <1.0 0.5 - 20.0 mg/dL  CK  Result Value Ref Range   Total CK 117 7 - 177 U/L      Assessment & Plan:    Problem List Items Addressed This Visit       Cardiovascular and Mediastinum   Essential (primary) hypertension - Primary    Bp not at goal with olmesartan '20mg'$  BP Readings from Last 3 Encounters:  10/15/22 (!) 150/82  02/02/21 140/77  11/30/19 122/82    Check BMP Increase med dose to '40mg'$  Advised about the importance of DASH diet F/up in 762month     Relevant Medications   olmesartan (BENICAR) 40 MG tablet     Other   Myalgia    Generalized muscle and joint pain, Onset with use of crestor x 274month Worse at night She Stopped crestor 62m20monthgo Symptoms improved but have not resolved. Normal cbc 04/2022, completed by previous [facility] pcp  Check bmp, tsh, ck, esr, and crp: normal Use mobic 7.'5mg'$  for pain      Relevant Orders   TSH (Completed)    Sedimentation rate (Completed)   C-reactive protein (Completed)   CK (Completed)   Prediabetes    Repeat hgbA1c      Relevant Orders  Hemoglobin A1c (Completed)   Other Visit Diagnoses     Urinary frequency       Relevant Orders   Urinalysis w microscopic + reflex cultur   Hypokalemia       Relevant Medications   potassium chloride SA (KLOR-CON M) 20 MEQ tablet      Return in about 4 weeks (around 11/12/2022) for HTN.     Wilfred Lacy, NP

## 2022-10-15 NOTE — Telephone Encounter (Signed)
Note sent to pt, via MyChart

## 2022-10-15 NOTE — Assessment & Plan Note (Signed)
Repeat hgbA1c 

## 2022-10-15 NOTE — Patient Instructions (Addendum)
Increase olmesartan to '40mg'$  daily Maintain DASh diet Start mobic 7.'5mg'$  daily for pain  DASH Eating Plan DASH stands for Dietary Approaches to Stop Hypertension. The DASH eating plan is a healthy eating plan that has been shown to: Reduce high blood pressure (hypertension). Reduce your risk for type 2 diabetes, heart disease, and stroke. Help with weight loss. What are tips for following this plan? Reading food labels Check food labels for the amount of salt (sodium) per serving. Choose foods with less than 5 percent of the Daily Value of sodium. Generally, foods with less than 300 milligrams (mg) of sodium per serving fit into this eating plan. To find whole grains, look for the word "whole" as the first word in the ingredient list. Shopping Buy products labeled as "low-sodium" or "no salt added." Buy fresh foods. Avoid canned foods and pre-made or frozen meals. Cooking Avoid adding salt when cooking. Use salt-free seasonings or herbs instead of table salt or sea salt. Check with your health care provider or pharmacist before using salt substitutes. Do not fry foods. Cook foods using healthy methods such as baking, boiling, grilling, roasting, and broiling instead. Cook with heart-healthy oils, such as olive, canola, avocado, soybean, or sunflower oil. Meal planning  Eat a balanced diet that includes: 4 or more servings of fruits and 4 or more servings of vegetables each day. Try to fill one-half of your plate with fruits and vegetables. 6-8 servings of whole grains each day. Less than 6 oz (170 g) of lean meat, poultry, or fish each day. A 3-oz (85-g) serving of meat is about the same size as a deck of cards. One egg equals 1 oz (28 g). 2-3 servings of low-fat dairy each day. One serving is 1 cup (237 mL). 1 serving of nuts, seeds, or beans 5 times each week. 2-3 servings of heart-healthy fats. Healthy fats called omega-3 fatty acids are found in foods such as walnuts, flaxseeds,  fortified milks, and eggs. These fats are also found in cold-water fish, such as sardines, salmon, and mackerel. Limit how much you eat of: Canned or prepackaged foods. Food that is high in trans fat, such as some fried foods. Food that is high in saturated fat, such as fatty meat. Desserts and other sweets, sugary drinks, and other foods with added sugar. Full-fat dairy products. Do not salt foods before eating. Do not eat more than 4 egg yolks a week. Try to eat at least 2 vegetarian meals a week. Eat more home-cooked food and less restaurant, buffet, and fast food. Lifestyle When eating at a restaurant, ask that your food be prepared with less salt or no salt, if possible. If you drink alcohol: Limit how much you use to: 0-1 drink a day for women who are not pregnant. 0-2 drinks a day for men. Be aware of how much alcohol is in your drink. In the [place] one drink equals one 12 oz bottle of beer (355 mL), one 5 oz glass of wine (148 mL), or one 1 oz glass of hard liquor (44 mL). General information Avoid eating more than 2,300 mg of salt a day. If you have hypertension, you may need to reduce your sodium intake to 1,500 mg a day. Work with your health care provider to maintain a healthy body weight or to lose weight. Ask what an ideal weight is for you. Get at least 30 minutes of exercise that causes your heart to beat faster (aerobic exercise) most days of the week. Activities  may include walking, swimming, or biking. Work with your health care provider or dietitian to adjust your eating plan to your individual calorie needs. What foods should I eat? Fruits All fresh, dried, or frozen fruit. Canned fruit in natural juice (without added sugar). Vegetables Fresh or frozen vegetables (raw, steamed, roasted, or grilled). Low-sodium or reduced-sodium tomato and vegetable juice. Low-sodium or reduced-sodium tomato sauce and tomato paste. Low-sodium or reduced-sodium canned  vegetables. Grains Whole-grain or whole-wheat bread. Whole-grain or whole-wheat pasta. Brown rice. [place]. Bulgur. Whole-grain and low-sodium cereals. Pita bread. Low-fat, low-sodium crackers. Whole-wheat flour tortillas. Meats and other proteins Skinless chicken or [place]. Ground chicken or [place]. Pork with fat trimmed off. Fish and seafood. Egg whites. Dried beans, peas, or lentils. Unsalted nuts, nut butters, and seeds. Unsalted canned beans. Lean cuts of beef with fat trimmed off. Low-sodium, lean precooked or cured meat, such as sausages or meat loaves. Dairy Low-fat (1%) or fat-free (skim) milk. Reduced-fat, low-fat, or fat-free cheeses. Nonfat, low-sodium ricotta or cottage cheese. Low-fat or nonfat yogurt. Low-fat, low-sodium cheese. Fats and oils Soft margarine without trans fats. Vegetable oil. Reduced-fat, low-fat, or light mayonnaise and salad dressings (reduced-sodium). Canola, safflower, olive, avocado, soybean, and sunflower oils. Avocado. Seasonings and condiments Herbs. Spices. Seasoning mixes without salt. Other foods Unsalted popcorn and pretzels. Fat-free sweets. The items listed above may not be a complete list of foods and beverages you can eat. Contact a dietitian for more information. What foods should I avoid? Fruits Canned fruit in a light or heavy syrup. Fried fruit. Fruit in cream or butter sauce. Vegetables Creamed or fried vegetables. Vegetables in a cheese sauce. Regular canned vegetables (not low-sodium or reduced-sodium). Regular canned tomato sauce and paste (not low-sodium or reduced-sodium). Regular tomato and vegetable juice (not low-sodium or reduced-sodium). Angie Fava. Olives. Grains Baked goods made with fat, such as croissants, muffins, or some breads. Dry pasta or rice meal packs. Meats and other proteins Fatty cuts of meat. Ribs. Fried meat. Berniece Salines. Bologna, salami, and other precooked or cured meats, such as sausages or meat loaves. Fat from  the back of a pig (fatback). Bratwurst. Salted nuts and seeds. Canned beans with added salt. Canned or smoked fish. Whole eggs or egg yolks. Chicken or [place] with skin. Dairy Whole or 2% milk, cream, and half-and-half. Whole or full-fat cream cheese. Whole-fat or sweetened yogurt. Full-fat cheese. Nondairy creamers. Whipped toppings. Processed cheese and cheese spreads. Fats and oils Butter. Stick margarine. Lard. Shortening. Ghee. Bacon fat. Tropical oils, such as coconut, palm kernel, or palm oil. Seasonings and condiments Onion salt, garlic salt, seasoned salt, table salt, and sea salt. Worcestershire sauce. Tartar sauce. Barbecue sauce. Teriyaki sauce. Soy sauce, including reduced-sodium. Steak sauce. Canned and packaged gravies. Fish sauce. Oyster sauce. Cocktail sauce. Store-bought horseradish. Ketchup. Mustard. Meat flavorings and tenderizers. Bouillon cubes. Hot sauces. Pre-made or packaged marinades. Pre-made or packaged taco seasonings. Relishes. Regular salad dressings. Other foods Salted popcorn and pretzels. The items listed above may not be a complete list of foods and beverages you should avoid. Contact a dietitian for more information. Where to find more information National Heart, Lung, and Blood Institute: https://wilson-eaton.com/ American Heart Association: www.heart.org Academy of Nutrition and Dietetics: www.eatright.[place]: www.kidney.org Summary The DASH eating plan is a healthy eating plan that has been shown to reduce high blood pressure (hypertension). It may also reduce your risk for type 2 diabetes, heart disease, and stroke. When on the DASH eating plan, aim to eat more fresh  fruits and vegetables, whole grains, lean proteins, low-fat dairy, and heart-healthy fats. With the DASH eating plan, you should limit salt (sodium) intake to 2,300 mg a day. If you have hypertension, you may need to reduce your sodium intake to 1,500 mg a day. Work with your  health care provider or dietitian to adjust your eating plan to your individual calorie needs. This information is not intended to replace advice given to you by your health care provider. Make sure you discuss any questions you have with your health care provider. Document Revised: 08/06/2019 Document Reviewed: 08/06/2019 Elsevier Patient Education  [place].

## 2022-10-15 NOTE — Assessment & Plan Note (Signed)
Bp not at goal with olmesartan '20mg'$  BP Readings from Last 3 Encounters:  10/15/22 (!) 150/82  02/02/21 140/77  11/30/19 122/82    Check BMP Increase med dose to '40mg'$  Advised about the importance of DASH diet F/up in 73month

## 2022-10-15 NOTE — Assessment & Plan Note (Addendum)
Generalized muscle and joint pain, Onset with use of crestor x 59month, Worse at night She Stopped crestor 245monthago Symptoms improved but have not resolved. Normal cbc 04/2022, completed by previous [facility] pcp  Check bmp, tsh, ck, esr, and crp: normal Use mobic 7.'5mg'$  for pain

## 2022-10-15 NOTE — Telephone Encounter (Signed)
Pt was seen by Kimberly Snow on 10/15/22 and is needing a note saying she was seen at her doctor's today. She wants this uploaded to her mychart. If any questions pt at [phone]

## 2022-10-16 ENCOUNTER — Telehealth: Payer: Self-pay | Admitting: Nurse Practitioner

## 2022-10-16 DIAGNOSIS — M791 Myalgia, unspecified site: Secondary | ICD-10-CM

## 2022-10-16 MED ORDER — MELOXICAM 7.5 MG PO TABS: 7.5000 mg | ORAL_TABLET | Freq: Every day | ORAL | 5 refills | Status: DC

## 2022-10-16 NOTE — Telephone Encounter (Signed)
Pt said she switched her pharmacy to [facility] in [place]. She would like her Mobic sent there please and then make that her pharmacy. Can you send a text to let her know when it's sent.

## 2022-10-18 ENCOUNTER — Other Ambulatory Visit: Payer: Self-pay | Admitting: Nurse Practitioner

## 2022-10-18 ENCOUNTER — Encounter: Payer: Self-pay | Admitting: Nurse Practitioner

## 2022-10-18 DIAGNOSIS — E782 Mixed hyperlipidemia: Secondary | ICD-10-CM

## 2022-10-18 MED ORDER — PRAVASTATIN SODIUM 20 MG PO TABS: 20.0000 mg | ORAL_TABLET | ORAL | 5 refills | Status: AC

## 2022-10-18 MED ORDER — POTASSIUM CHLORIDE CRYS ER 20 MEQ PO TBCR: 40.0000 meq | EXTENDED_RELEASE_TABLET | Freq: Every day | ORAL | 0 refills | Status: DC

## 2022-10-18 NOTE — Assessment & Plan Note (Signed)
Unable to tolerate crestor (myalgia) Normal ESR, CK, ANA, and CRP Try pravastatin '20mg'$  2x/week

## 2022-10-21 NOTE — Progress Notes (Signed)
Pt has been contacted to return to office. Pt is aware and states she will rtc on Thursday at 2:00 pm.

## 2022-10-24 ENCOUNTER — Other Ambulatory Visit (INDEPENDENT_AMBULATORY_CARE_PROVIDER_SITE_OTHER): Payer: Commercial Managed Care - PPO

## 2022-10-24 DIAGNOSIS — R350 Frequency of micturition: Secondary | ICD-10-CM | POA: Diagnosis not present

## 2022-10-25 LAB — URINALYSIS W MICROSCOPIC + REFLEX CULTURE
Bacteria, UA: NONE SEEN /HPF
Bilirubin Urine: NEGATIVE
Glucose, UA: NEGATIVE
Hgb urine dipstick: NEGATIVE
Hyaline Cast: NONE SEEN /LPF
Ketones, ur: NEGATIVE
Leukocyte Esterase: NEGATIVE
Nitrites, Initial: NEGATIVE
Protein, ur: NEGATIVE
RBC / HPF: NONE SEEN /HPF (ref 0–2)
Specific Gravity, Urine: 1.014 (ref 1.001–1.035)
WBC, UA: NONE SEEN /HPF (ref 0–5)
pH: 6 (ref 5.0–8.0)

## 2022-10-25 LAB — NO CULTURE INDICATED

## 2022-11-12 ENCOUNTER — Ambulatory Visit: Payer: Commercial Managed Care - PPO | Admitting: Nurse Practitioner

## 2022-11-12 ENCOUNTER — Encounter: Payer: Self-pay | Admitting: Nurse Practitioner

## 2022-11-12 VITALS — BP 160/82 | HR 86 | Temp 98.0°F | Ht 69.5 in | Wt 271.2 lb

## 2022-11-12 DIAGNOSIS — N760 Acute vaginitis: Secondary | ICD-10-CM | POA: Diagnosis not present

## 2022-11-12 DIAGNOSIS — I10 Essential (primary) hypertension: Secondary | ICD-10-CM | POA: Diagnosis not present

## 2022-11-12 DIAGNOSIS — E782 Mixed hyperlipidemia: Secondary | ICD-10-CM | POA: Diagnosis not present

## 2022-11-12 DIAGNOSIS — Z1231 Encounter for screening mammogram for malignant neoplasm of breast: Secondary | ICD-10-CM

## 2022-11-12 LAB — BASIC METABOLIC PANEL
BUN: 12 mg/dL (ref 6–23)
CO2: 31 mEq/L (ref 19–32)
Calcium: 9.1 mg/dL (ref 8.4–10.5)
Chloride: 103 mEq/L (ref 96–112)
Creatinine, Ser: 0.84 mg/dL (ref 0.40–1.20)
GFR: 77.81 mL/min (ref >60.00)
Glucose, Bld: 114 mg/dL — ABNORMAL HIGH (ref 70–99)
Potassium: 3.6 mEq/L (ref 3.5–5.1)
Sodium: 141 mEq/L (ref 135–145)

## 2022-11-12 MED ORDER — OLMESARTAN MEDOXOMIL 20 MG PO TABS: 20.0000 mg | ORAL_TABLET | Freq: Every day | ORAL | 5 refills | Status: DC

## 2022-11-12 MED ORDER — AMLODIPINE BESYLATE 5 MG PO TABS: 5.0000 mg | ORAL_TABLET | Freq: Every day | ORAL | 5 refills | Status: DC

## 2022-11-12 MED ORDER — FLUCONAZOLE 150 MG PO TABS: 150.0000 mg | ORAL_TABLET | Freq: Every day | ORAL | 0 refills | Status: DC

## 2022-11-12 NOTE — Progress Notes (Signed)
Established Patient Visit  Patient: Kimberly Snow   DOB: 03/03/1966   57 y.o. Female  MRN: [id number] Visit Date: 11/12/2022  Subjective:    Chief Complaint  Patient presents with   Hypertension   Follow-up    Home BP about 153/89. New dose of olmesartan causing tachycardia and anxiety. Reports taking BP med everyday. Was prescribed ABX, which caused yeast infection around [employer] script to treat. Scheduled for [facility] 12/09/22   HPI Essential hypertension BP not at goal with olmesartan '20mg'$  Reports she is unable to tolerate '40mg'$  (dizziness and palpitations). Reports she is compliant with DASH diet and and daily exercise BP Readings from Last 3 Encounters:  11/12/22 (!) 160/82  10/15/22 (!) 150/82  02/02/21 140/77    Add amlodipine '5mg'$  Decrease olmesartan to '20mg'$  Repeat BMP due to previous hypokalemia F/up in 68month  Reports vaginal itching and vaginal discharge-thick and [place], onset 2days ago. Completed augmentin for dental abscess 5days ago. Denies any need for STD screen, no pelvic pain, no fever  BP Readings from Last 3 Encounters:  11/12/22 (!) 160/82  10/15/22 (!) 150/82  02/02/21 140/77    Wt Readings from Last 3 Encounters:  11/12/22 271 lb 3.2 oz (123 kg)  10/15/22 273 lb 12.8 oz (124.2 kg)  12/30/19 247 lb (112 kg)    Reviewed medical, surgical, and social history today  Medications: Outpatient Medications Prior to Visit  Medication Sig   albuterol (PROVENTIL HFA;VENTOLIN HFA) 108 (90 Base) MCG/ACT inhaler Inhale into the lungs.   meloxicam (MOBIC) 7.5 MG tablet Take 1 tablet (7.5 mg total) by mouth daily. With food   pravastatin (PRAVACHOL) 20 MG tablet Take 1 tablet (20 mg total) by mouth 2 (two) times a week.   [DISCONTINUED] olmesartan (BENICAR) 40 MG tablet Take 1 tablet (40 mg total) by mouth daily.   [DISCONTINUED] hydrochlorothiazide (HYDRODIURIL) 25 MG tablet Take 1 tablet (25 mg total) by mouth daily. (Patient  not taking: Reported on 10/15/2022)   [DISCONTINUED] potassium chloride SA (KLOR-CON M) 20 MEQ tablet Take 2 tablets (40 mEq total) by mouth daily. (Patient not taking: Reported on 11/12/2022)   No facility-administered medications prior to visit.   Reviewed past medical and social history.   ROS per HPI above      Objective:  BP (!) 160/82   Pulse 86   Temp 98 F (36.7 C) (Temporal)   Ht 5' 9.5" (1.765 m)   Wt 271 lb 3.2 oz (123 kg)   LMP 07/30/2015   SpO2 98%   BMI 39.48 kg/m      Physical Exam Cardiovascular:     Rate and Rhythm: Normal rate and regular rhythm.     Pulses: Normal pulses.     Heart sounds: Normal heart sounds.  Pulmonary:     Effort: Pulmonary effort is normal.     Breath sounds: Normal breath sounds.  Neurological:     Mental Status: She is alert and oriented to person, place, and time.     No results found for any visits on 11/12/22.    Assessment & Plan:    Problem List Items Addressed This Visit       Cardiovascular and Mediastinum   Essential hypertension    BP not at goal with olmesartan '20mg'$  Reports she is unable to tolerate '40mg'$  (dizziness and palpitations). Reports she is compliant with DASH diet and and daily exercise BP Readings  from Last 3 Encounters:  11/12/22 (!) 160/82  10/15/22 (!) 150/82  02/02/21 140/77    Add amlodipine '5mg'$  Decrease olmesartan to '20mg'$  Repeat BMP due to previous hypokalemia F/up in 72month     Relevant Medications   amLODipine (NORVASC) 5 MG tablet   olmesartan (BENICAR) 20 MG tablet   Other Relevant Orders   Basic metabolic panel     Other   Mixed hyperlipidemia   Relevant Medications   amLODipine (NORVASC) 5 MG tablet   olmesartan (BENICAR) 20 MG tablet   Other Visit Diagnoses     Encounter for screening mammogram for breast cancer    -  Primary   Relevant Orders   MM DIGITAL SCREENING BILATERAL   Acute vaginitis       Relevant Medications   fluconazole (DIFLUCAN) 150 MG tablet       Return in about 4 weeks (around 12/10/2022) for HTN.     CWilfred Lacy NP

## 2022-11-12 NOTE — Patient Instructions (Signed)
Decrease olmesartan dose to '20mg'$  Start amlodipine '5mg'$  at hs Bring BP readings to next appt Go to lab

## 2022-11-12 NOTE — Progress Notes (Signed)
Stable Follow instructions as discussed during office visit.

## 2022-11-12 NOTE — Assessment & Plan Note (Addendum)
BP not at goal with olmesartan '20mg'$  Reports she is unable to tolerate '40mg'$  (dizziness and palpitations). Reports she is compliant with DASH diet and and daily exercise BP Readings from Last 3 Encounters:  11/12/22 (!) 160/82  10/15/22 (!) 150/82  02/02/21 140/77    Add amlodipine '5mg'$  Decrease olmesartan to '20mg'$  Repeat BMP due to previous hypokalemia F/up in 52month

## 2022-11-19 ENCOUNTER — Other Ambulatory Visit: Payer: Self-pay | Admitting: Nurse Practitioner

## 2022-11-19 DIAGNOSIS — E876 Hypokalemia: Secondary | ICD-10-CM

## 2022-11-27 ENCOUNTER — Ambulatory Visit: Payer: Commercial Managed Care - PPO | Admitting: Nurse Practitioner

## 2022-11-27 ENCOUNTER — Ambulatory Visit: Payer: Commercial Managed Care - PPO | Attending: Nurse Practitioner

## 2022-11-27 ENCOUNTER — Encounter: Payer: Self-pay | Admitting: Nurse Practitioner

## 2022-11-27 VITALS — BP 150/82 | HR 105 | Temp 100.0°F | Resp 16 | Ht 69.0 in | Wt 269.0 lb

## 2022-11-27 DIAGNOSIS — R6889 Other general symptoms and signs: Secondary | ICD-10-CM

## 2022-11-27 DIAGNOSIS — I10 Essential (primary) hypertension: Secondary | ICD-10-CM

## 2022-11-27 DIAGNOSIS — R0683 Snoring: Secondary | ICD-10-CM | POA: Diagnosis not present

## 2022-11-27 DIAGNOSIS — R002 Palpitations: Secondary | ICD-10-CM | POA: Diagnosis not present

## 2022-11-27 LAB — POCT INFLUENZA A/B
Influenza A, POC: NEGATIVE
Influenza B, POC: NEGATIVE

## 2022-11-27 LAB — POC COVID19 BINAXNOW: SARS Coronavirus 2 Ag: NEGATIVE

## 2022-11-27 MED ORDER — AMLODIPINE BESYLATE 10 MG PO TABS: 10.0000 mg | ORAL_TABLET | Freq: Every day | ORAL | 3 refills | Status: DC

## 2022-11-27 NOTE — Patient Instructions (Addendum)
Increase amlodipine to '10mg'$  daily Maintain olmesartan dose Get BP machine with larger cuff You will be contacted to schedule appt with sleep clinic and for holter monitor.  Encourage adequate oral hydration: 64oz Avoid decongestants if you have high blood pressure. Encourage adequate oral hydration. Use mucinex DM or Robitussin  or delsym for cough.  You can use plain "Tylenol" or "Advil" for fever, chills and achyness. Use cool mist humidifier at bedtime to help with nasal congestion and cough.  "Common cold" symptoms are usually triggered by a virus.  The antibiotics are usually not necessary. On average, a" viral cold" illness may take 7-10 days to resolve. Please, make an appointment if you are not better or if you're worse.

## 2022-11-27 NOTE — Progress Notes (Signed)
Established Patient Visit  Patient: Kimberly Snow   DOB: 04/03/1966   57 y.o. Female  MRN: [id number] Visit Date: 12/02/2022  Subjective:    Chief Complaint  Patient presents with   Hypertension   URI   Palpitations  This is a recurrent problem. The current episode started more than 1 month ago. The problem occurs constantly. The problem has been gradually worsening. On average, each episode lasts 5 minutes. Exacerbated by: bedtime. Associated symptoms include an irregular heartbeat. Pertinent negatives include no anxiety, chest fullness, chest pain, coughing, diaphoresis, dizziness, fever, malaise/fatigue, nausea, near-syncope, numbness, shortness of breath, syncope, vomiting or weakness. Associated symptoms comments: And AM headache. Reported snoring per spouse. No observed apneic episodes. She has tried nothing for the symptoms. Risk factors include obesity and post menopause. There is no history of anemia, anxiety, drug use, heart disease, hyperthyroidism or a valve disorder.  URI  This is a new problem. The current episode started in the past 7 days. The problem has been unchanged. There has been no fever. Associated symptoms include congestion, rhinorrhea and sinus pain. Pertinent negatives include no chest pain, coughing, nausea or vomiting. She has tried nothing for the symptoms.   Essential hypertension BP not at goal Reports she maintain low sodium diet and walking 3x/week Home BP 150-140s/90s BP Readings from Last 3 Encounters:  11/27/22 (!) 150/82  11/12/22 (!) 160/82  10/15/22 (!) 150/82    Increase amlodipine to 10mg  daily Maintain olmesartan dose Get BP machine with larger cuff F/up in 56month  Wt Readings from Last 3 Encounters:  11/27/22 269 lb (122 kg)  11/12/22 271 lb 3.2 oz (123 kg)  10/15/22 273 lb 12.8 oz (124.2 kg)    Reviewed medical, surgical, and social history today  Medications: Outpatient Medications Prior to Visit  Medication Sig    albuterol (PROVENTIL HFA;VENTOLIN HFA) 108 (90 Base) MCG/ACT inhaler Inhale into the lungs.   fluconazole (DIFLUCAN) 150 MG tablet Take 1 tablet (150 mg total) by mouth daily. Take second tab 3days apart from first tab   meloxicam (MOBIC) 7.5 MG tablet Take 1 tablet (7.5 mg total) by mouth daily. With food   olmesartan (BENICAR) 20 MG tablet Take 1 tablet (20 mg total) by mouth daily.   pravastatin (PRAVACHOL) 20 MG tablet Take 1 tablet (20 mg total) by mouth 2 (two) times a week.   [DISCONTINUED] amLODipine (NORVASC) 5 MG tablet Take 1 tablet (5 mg total) by mouth at bedtime.   No facility-administered medications prior to visit.   Reviewed past medical and social history.   ROS per HPI above      Objective:  BP (!) 150/82   Pulse (!) 105   Temp 100 F (37.8 C) (Oral)   Resp 16   Ht 5\' 9"  (1.753 m)   Wt 269 lb (122 kg)   LMP 07/30/2015   SpO2 98%   BMI 39.72 kg/m      Physical Exam Constitutional:      General: She is not in acute distress. HENT:     Right Ear: Tympanic membrane, ear canal and external ear normal.     Left Ear: Tympanic membrane, ear canal and external ear normal.     Nose: No nasal tenderness, mucosal edema, congestion or rhinorrhea.     Right Nostril: No occlusion.     Left Nostril: No occlusion.     Right Turbinates: Not enlarged, swollen  or pale.     Left Turbinates: Not enlarged, swollen or pale.     Right Sinus: No maxillary sinus tenderness or frontal sinus tenderness.     Left Sinus: No maxillary sinus tenderness or frontal sinus tenderness.     Mouth/Throat:     Pharynx: Oropharynx is clear. Uvula midline.     Tonsils: No tonsillar exudate or tonsillar abscesses.  Eyes:     Extraocular Movements: Extraocular movements intact.     Conjunctiva/sclera: Conjunctivae normal.  Cardiovascular:     Rate and Rhythm: Normal rate and regular rhythm.     Pulses: Normal pulses.     Heart sounds: Normal heart sounds.  Pulmonary:     Effort:  Pulmonary effort is normal.     Breath sounds: Normal breath sounds.  Musculoskeletal:     Cervical back: Normal range of motion and neck supple.     Right lower leg: No edema.     Left lower leg: No edema.  Lymphadenopathy:     Cervical: No cervical adenopathy.  Neurological:     Mental Status: She is alert and oriented to person, place, and time.     Results for orders placed or performed in visit on 11/27/22  POC COVID-19  Result Value Ref Range   SARS Coronavirus 2 Ag Negative Negative  POCT Influenza A/B  Result Value Ref Range   Influenza A, POC Negative Negative   Influenza B, POC Negative Negative      Assessment & Plan:    Problem List Items Addressed This Visit       Cardiovascular and Mediastinum   Essential hypertension - Primary    BP not at goal Reports she maintain low sodium diet and walking 3x/week Home BP 150-140s/90s BP Readings from Last 3 Encounters:  11/27/22 (!) 150/82  11/12/22 (!) 160/82  10/15/22 (!) 150/82    Increase amlodipine to 10mg  daily Maintain olmesartan dose Get BP machine with larger cuff F/up in 73month      Relevant Medications   amLODipine (NORVASC) 10 MG tablet   Other Relevant Orders   Ambulatory referral to Sleep Studies   Other Visit Diagnoses     Flu-like symptoms       Relevant Orders   POC COVID-19 (Completed)   POCT Influenza A/B (Completed)   Intermittent palpitations       Relevant Orders   Ambulatory referral to Sleep Studies   LONG TERM MONITOR (3-14 DAYS)   Loud snoring       Relevant Orders   Ambulatory referral to Sleep Studies     Encourage adequate oral hydration: 64oz Avoid decongestants if you have high blood pressure. Encourage adequate oral hydration. Use mucinex DM or Robitussin  or delsym for cough.  You can use plain "Tylenol" or "Advil" for fever, chills and achyness. Use cool mist humidifier at bedtime to help with nasal congestion and cough.  "Common cold" symptoms are usually  triggered by a virus.  The antibiotics are usually not necessary. On average, a" viral cold" illness may take 7-10 days to resolve. Please, make an appointment if you are not better or if you're worse.   Return in about 4 weeks (around 12/25/2022) for HTN.     Wilfred Lacy, NP

## 2022-11-27 NOTE — Progress Notes (Unsigned)
Enrolled for [employer] to mail a ZIO XT long term holter monitor to the patients address on file.   DOD to read. 

## 2022-11-30 DIAGNOSIS — R002 Palpitations: Secondary | ICD-10-CM

## 2022-12-02 NOTE — Assessment & Plan Note (Signed)
BP not at goal Reports she maintain low sodium diet and walking 3x/week Home BP 150-140s/90s BP Readings from Last 3 Encounters:  11/27/22 (!) 150/82  11/12/22 (!) 160/82  10/15/22 (!) 150/82    Increase amlodipine to 10mg  daily Maintain olmesartan dose Get BP machine with larger cuff F/up in 34month

## 2022-12-09 ENCOUNTER — Ambulatory Visit: Payer: Commercial Managed Care - PPO | Admitting: Nurse Practitioner

## 2022-12-09 ENCOUNTER — Ambulatory Visit
Admission: RE | Admit: 2022-12-09 | Discharge: 2022-12-09 | Disposition: A | Discharge status: Home / Self Care | Payer: Commercial Managed Care - PPO | Source: Ambulatory Visit | Attending: Nurse Practitioner | Admitting: Nurse Practitioner

## 2022-12-09 ENCOUNTER — Other Ambulatory Visit: Payer: Self-pay | Admitting: Nurse Practitioner

## 2022-12-09 DIAGNOSIS — E782 Mixed hyperlipidemia: Secondary | ICD-10-CM

## 2022-12-09 DIAGNOSIS — Z1231 Encounter for screening mammogram for malignant neoplasm of breast: Secondary | ICD-10-CM

## 2022-12-09 DIAGNOSIS — N760 Acute vaginitis: Secondary | ICD-10-CM

## 2022-12-09 DIAGNOSIS — I10 Essential (primary) hypertension: Secondary | ICD-10-CM

## 2022-12-24 ENCOUNTER — Telehealth: Payer: Self-pay | Admitting: Nurse Practitioner

## 2022-12-24 ENCOUNTER — Other Ambulatory Visit: Payer: Self-pay

## 2022-12-24 DIAGNOSIS — M791 Myalgia, unspecified site: Secondary | ICD-10-CM

## 2022-12-24 MED ORDER — MELOXICAM 7.5 MG PO TABS: 7.5000 mg | ORAL_TABLET | Freq: Every day | ORAL | 5 refills | Status: AC

## 2022-12-24 NOTE — Telephone Encounter (Signed)
Pt called and said she need a refill on the inflammation and pain medication. Pt did not know the name of the medication. Pt also wanted to let you know her blood pressure has been great

## 2022-12-31 ENCOUNTER — Institutional Professional Consult (permissible substitution): Payer: Commercial Managed Care - PPO | Admitting: Neurology

## 2023-02-11 ENCOUNTER — Telehealth: Payer: Self-pay | Admitting: Nurse Practitioner

## 2023-02-11 NOTE — Telephone Encounter (Signed)
Called and made patient aware that swelling is a side affect of Amlodipine.  She should keep her appointment for 5/29 to discuss this issue

## 2023-02-11 NOTE — Telephone Encounter (Signed)
Pt coming tomorrow but would still like a call about the amlodipine she is taking. She stated it is causing swelling in her lower legs.

## 2023-02-12 ENCOUNTER — Encounter: Payer: Self-pay | Admitting: Nurse Practitioner

## 2023-02-12 ENCOUNTER — Ambulatory Visit: Payer: Commercial Managed Care - PPO | Admitting: Nurse Practitioner

## 2023-02-12 VITALS — BP 138/80 | HR 93 | Temp 98.0°F | Resp 16 | Ht 69.0 in | Wt 273.4 lb

## 2023-02-12 DIAGNOSIS — E782 Mixed hyperlipidemia: Secondary | ICD-10-CM

## 2023-02-12 DIAGNOSIS — R7303 Prediabetes: Secondary | ICD-10-CM

## 2023-02-12 DIAGNOSIS — Z6841 Body Mass Index (BMI) 40.0 and over, adult: Secondary | ICD-10-CM

## 2023-02-12 DIAGNOSIS — I10 Essential (primary) hypertension: Secondary | ICD-10-CM | POA: Diagnosis not present

## 2023-02-12 LAB — POCT GLYCOSYLATED HEMOGLOBIN (HGB A1C): Hemoglobin A1C: 6.1 % — AB (ref 4.0–5.6)

## 2023-02-12 MED ORDER — OLMESARTAN MEDOXOMIL 20 MG PO TABS: 20.0000 mg | ORAL_TABLET | Freq: Every day | ORAL | 3 refills | Status: DC

## 2023-02-12 MED ORDER — WEGOVY 0.25 MG/0.5ML ~~LOC~~ SOAJ
0.2500 mg | SUBCUTANEOUS | 0 refills | Status: DC
Start: 2023-02-12 — End: 2023-03-12

## 2023-02-12 MED ORDER — AMLODIPINE BESYLATE 10 MG PO TABS
10.0000 mg | ORAL_TABLET | Freq: Every day | ORAL | 3 refills | Status: DC
Start: 2023-02-12 — End: 2023-02-25

## 2023-02-12 NOTE — Progress Notes (Unsigned)
Established Patient Visit  Patient: Kimberly Snow   DOB: 04/17/1966   57 y.o. Female  MRN: [id number] Visit Date: 02/13/2023  Subjective:    Chief Complaint  Patient presents with   Hypertension   Edema    Pt stopped taking her bp medication because she was having lower extremity swelling.    Essential hypertension Stopped amlopidine and benicar 2days ago due to LE edema. She took both meds iin AM due to work schedule. Resolved LE edema with discontinuation of amlodipine. Home BP reading: 129/84-135/85 prior to med discontinuation. We discussed need to resume benicar and change amlodipine. She agreed to resume benicar, but declined to change amlodipine. She decided to start taking amlodipine at bedtime. BP Readings from Last 3 Encounters:  02/12/23 138/80  11/27/22 (!) 150/82  11/12/22 (!) 160/82    Maintain current med doses F/up in 11month  Obesity (BMI 30.0-34.9) Difficulty with weight loss despite daily exercise and diet modifications (low fat and low carb). Agreed to start wegovy. She was advised about possible side effects. Wt Readings from Last 3 Encounters:  02/12/23 273 lb 6.4 oz (124 kg)  11/27/22 269 lb (122 kg)  11/12/22 271 lb 3.2 oz (123 kg)    Advised to maintain heart healthy diet and daily exercise. Provided printed information  Prediabetes Repeat hgbA1c: 6.1% Advised about her risk for diabetes  Reviewed medical, surgical, and social history today  Medications: Outpatient Medications Prior to Visit  Medication Sig   albuterol (PROVENTIL HFA;VENTOLIN HFA) 108 (90 Base) MCG/ACT inhaler Inhale into the lungs.   meloxicam (MOBIC) 7.5 MG tablet Take 1 tablet (7.5 mg total) by mouth daily. With food   pravastatin (PRAVACHOL) 20 MG tablet Take 1 tablet (20 mg total) by mouth 2 (two) times a week.   [DISCONTINUED] amLODipine (NORVASC) 10 MG tablet Take 1 tablet (10 mg total) by mouth at bedtime. (Patient not taking: Reported on 02/12/2023)    [DISCONTINUED] fluconazole (DIFLUCAN) 150 MG tablet Take 1 tablet (150 mg total) by mouth daily. Take second tab 3days apart from first tab (Patient not taking: Reported on 02/12/2023)   [DISCONTINUED] olmesartan (BENICAR) 20 MG tablet Take 1 tablet (20 mg total) by mouth daily. (Patient not taking: Reported on 02/12/2023)   No facility-administered medications prior to visit.   Reviewed past medical and social history.   ROS per HPI above      Objective:  BP 138/80 (BP Location: Left Arm, Patient Position: Sitting, Cuff Size: Large)   Pulse 93   Temp 98 F (36.7 C) (Temporal)   Resp 16   Ht 5\' 9"  (1.753 m)   Wt 273 lb 6.4 oz (124 kg)   LMP 07/30/2015   SpO2 98%   BMI 40.37 kg/m      Physical Exam Vitals and nursing note reviewed.  Cardiovascular:     Rate and Rhythm: Normal rate and regular rhythm.     Pulses: Normal pulses.     Heart sounds: Normal heart sounds.  Pulmonary:     Effort: Pulmonary effort is normal.     Breath sounds: Normal breath sounds.  Musculoskeletal:     Right lower leg: No edema.     Left lower leg: No edema.  Neurological:     Mental Status: She is alert and oriented to person, place, and time.     Results for orders placed or performed in visit on 02/12/23  POCT  glycosylated hemoglobin (Hb A1C)  Result Value Ref Range   Hemoglobin A1C 6.1 (A) 4.0 - 5.6 %   HbA1c POC (<> result, manual entry)     HbA1c, POC (prediabetic range)     HbA1c, POC (controlled diabetic range)        Assessment & Plan:    Problem List Items Addressed This Visit       Cardiovascular and Mediastinum   Essential hypertension - Primary    Stopped amlopidine and benicar 2days ago due to LE edema. She took both meds iin AM due to work schedule. Resolved LE edema with discontinuation of amlodipine. Home BP reading: 129/84-135/85 prior to med discontinuation. We discussed need to resume benicar and change amlodipine. She agreed to resume benicar, but declined to  change amlodipine. She decided to start taking amlodipine at bedtime. BP Readings from Last 3 Encounters:  02/12/23 138/80  11/27/22 (!) 150/82  11/12/22 (!) 160/82    Maintain current med doses F/up in 56month      Relevant Medications   amLODipine (NORVASC) 10 MG tablet   olmesartan (BENICAR) 20 MG tablet   Semaglutide-Weight Management (WEGOVY) 0.25 MG/0.5ML SOAJ     Other   Mixed hyperlipidemia   Relevant Medications   amLODipine (NORVASC) 10 MG tablet   olmesartan (BENICAR) 20 MG tablet   Semaglutide-Weight Management (WEGOVY) 0.25 MG/0.5ML SOAJ   Obesity (BMI 30.0-34.9)    Difficulty with weight loss despite daily exercise and diet modifications (low fat and low carb). Agreed to start wegovy. She was advised about possible side effects. Wt Readings from Last 3 Encounters:  02/12/23 273 lb 6.4 oz (124 kg)  11/27/22 269 lb (122 kg)  11/12/22 271 lb 3.2 oz (123 kg)    Advised to maintain heart healthy diet and daily exercise. Provided printed information      Relevant Medications   Semaglutide-Weight Management (WEGOVY) 0.25 MG/0.5ML SOAJ   Prediabetes    Repeat hgbA1c: 6.1% Advised about her risk for diabetes      Relevant Medications   Semaglutide-Weight Management (WEGOVY) 0.25 MG/0.5ML SOAJ   Other Relevant Orders   POCT glycosylated hemoglobin (Hb A1C) (Completed)   Advised to take amlodipine in PM and benicar in AM.  Return in about 4 weeks (around 03/12/2023) for Weight management.     Alysia Penna, NP

## 2023-02-12 NOTE — Patient Instructions (Signed)
Maintain current med dose Take amlodipine at bedtime Start wegovy  Semaglutide Injection (Weight Management) What is this medication? SEMAGLUTIDE (SEM a GLOO tide) promotes weight loss. It may also be used to maintain weight loss. It works by decreasing appetite. Changes to diet and exercise are often combined with this medication. This medicine may be used for other purposes; ask your health care provider or pharmacist if you have questions. COMMON BRAND NAME(S): [id number] What should I tell my care team before I take this medication? They need to know if you have any of these conditions: Endocrine tumors (MEN 2) or if someone in your family had these tumors Eye disease, vision problems Gallbladder disease History of depression or mental health disease History of pancreatitis Kidney disease Stomach or intestine problems Suicidal thoughts, plans, or attempt; a previous suicide attempt by you or a family member Thyroid cancer or if someone in your family had thyroid cancer An unusual or allergic reaction to semaglutide, other medications, foods, dyes, or preservatives Pregnant or trying to get pregnant Breast-feeding How should I use this medication? This medication is injected under the skin. You will be taught how to prepare and give it. Take it as directed on the prescription label. It is given once every week (every 7 days). Keep taking it unless your care team tells you to stop. It is important that you put your used needles and pens in a special sharps container. Do not put them in a trash can. If you do not have a sharps container, call your pharmacist or care team to get one. A special MedGuide will be given to you by the pharmacist with each prescription and refill. Be sure to read this information carefully each time. This medication comes with INSTRUCTIONS FOR USE. Ask your pharmacist for directions on how to use this medication. Read the information carefully. Talk to your  pharmacist or care team if you have questions. Talk to your care team about the use of this medication in children. While it may be prescribed for children as young as 12 years for selected conditions, precautions do apply. Overdosage: If you think you have taken too much of this medicine contact a poison control center or emergency room at once. NOTE: This medicine is only for you. Do not share this medicine with others. What if I miss a dose? If you miss a dose and the next scheduled dose is more than 2 days away, take the missed dose as soon as possible. If you miss a dose and the next scheduled dose is less than 2 days away, do not take the missed dose. Take the next dose at your regular time. Do not take double or extra doses. If you miss your dose for 2 weeks or more, take the next dose at your regular time or call your care team to talk about how to restart this medication. What may interact with this medication? Insulin and other medications for diabetes This list may not describe all possible interactions. Give your health care provider a list of all the medicines, herbs, non-prescription drugs, or dietary supplements you use. Also tell them if you smoke, drink alcohol, or use illegal drugs. Some items may interact with your medicine. What should I watch for while using this medication? Visit your care team for regular checks on your progress. It may be some time before you see the benefit from this medication. Drink plenty of fluids while taking this medication. Check with your care team if you  have severe diarrhea, nausea, and vomiting, or if you sweat a lot. The loss of too much body fluid may make it dangerous for you to take this medication. This medication may affect blood sugar levels. Ask your care team if changes in diet or medications are needed if you have diabetes. Talk to your care team if may be pregnant. Losing weight while pregnant is not advised and may cause harm to the fetus.  Talk to your care team for more information. What side effects may I notice from receiving this medication? Side effects that you should report to your care team as soon as possible: Allergic reactions--skin rash, itching, hives, swelling of the face, lips, tongue, or throat Change in vision Dehydration--increased thirst, dry mouth, feeling faint or lightheaded, headache, dark yellow or brown urine Gallbladder problems--severe stomach pain, nausea, vomiting, fever Heart palpitations--rapid, pounding, or irregular heartbeat Kidney injury--decrease in the amount of urine, swelling of the ankles, hands, or feet Pancreatitis--severe stomach pain that spreads to your back or gets worse after eating or when touched, fever, nausea, vomiting Thoughts of suicide or self-harm, worsening mood, feelings of depression Thyroid cancer--new mass or lump in the neck, pain or trouble swallowing, trouble breathing, hoarseness Side effects that usually do not require medical attention (report these to your care team if they continue or are bothersome): Diarrhea Loss of appetite Nausea Upset stomach This list may not describe all possible side effects. Call your doctor for medical advice about side effects. You may report side effects to FDA at 1-800-FDA-1088. Where should I keep my medication? Keep out of the reach of children and pets. Refrigeration (preferred): Store in the refrigerator. Do not freeze. Keep this medication in the original container until you are ready to take it. Get rid of any unused medication after the expiration date. Room temperature: If needed, prior to cap removal, the pen can be stored at room temperature for up to 28 days. Protect from light. If it is stored at room temperature, get rid of any unused medication after 28 days or after it expires, whichever is first. It is important to get rid of the medication as soon as you no longer need it or it is expired. You can do this in two  ways: Take the medication to a medication take-back program. Check with your pharmacy or law enforcement to find a location. If you cannot return the medication, follow the directions in the MedGuide. NOTE: This sheet is a summary. It may not cover all possible information. If you have questions about this medicine, talk to your doctor, pharmacist, or health care provider.  2024 Elsevier/Gold Standard (2022-11-10 00:00:00)

## 2023-02-13 ENCOUNTER — Encounter: Payer: Self-pay | Admitting: Nurse Practitioner

## 2023-02-13 NOTE — Assessment & Plan Note (Signed)
Stopped amlopidine and benicar 2days ago due to LE edema. She took both meds iin AM due to work schedule. Resolved LE edema with discontinuation of amlodipine. Home BP reading: 129/84-135/85 prior to med discontinuation. We discussed need to resume benicar and change amlodipine. She agreed to resume benicar, but declined to change amlodipine. She decided to start taking amlodipine at bedtime. BP Readings from Last 3 Encounters:  02/12/23 138/80  11/27/22 (!) 150/82  11/12/22 (!) 160/82    Maintain current med doses F/up in 12month

## 2023-02-13 NOTE — Assessment & Plan Note (Signed)
>>  ASSESSMENT AND PLAN FOR OBESITY (BMI 30.0-34.9) WRITTEN ON 02/13/2023  4:42 PM BY Shawndrea Rutkowski LUM, NP  Difficulty with weight loss despite daily exercise and diet modifications (low fat and low carb). Agreed to start wegovy. She was advised about possible side effects. Wt Readings from Last 3 Encounters:  02/12/23 273 lb 6.4 oz (124 kg)  11/27/22 269 lb (122 kg)  11/12/22 271 lb 3.2 oz (123 kg)    Advised to maintain heart healthy diet and daily exercise. Provided printed information

## 2023-02-13 NOTE — Assessment & Plan Note (Signed)
Repeat hgbA1c: 6.1% Advised about her risk for diabetes

## 2023-02-13 NOTE — Assessment & Plan Note (Signed)
Difficulty with weight loss despite daily exercise and diet modifications (low fat and low carb). Agreed to start wegovy. She was advised about possible side effects. Wt Readings from Last 3 Encounters:  02/12/23 273 lb 6.4 oz (124 kg)  11/27/22 269 lb (122 kg)  11/12/22 271 lb 3.2 oz (123 kg)    Advised to maintain heart healthy diet and daily exercise. Provided printed information

## 2023-02-21 ENCOUNTER — Telehealth: Payer: Self-pay

## 2023-02-21 NOTE — Telephone Encounter (Signed)
*  Primary  PA request received for Wegovy 0.25MG /0.5ML auto-injectors  PA submitted to [facility] and is pending determination  Key: [id number]  *Difficulty with weight loss despite daily exercise and diet modifications (low fat and low carb).

## 2023-02-21 NOTE — Telephone Encounter (Signed)
PA has been submitted and is pending determination. Will be updated in additional encounter.

## 2023-02-24 ENCOUNTER — Telehealth: Payer: Self-pay

## 2023-02-24 DIAGNOSIS — I10 Essential (primary) hypertension: Secondary | ICD-10-CM

## 2023-02-24 NOTE — Telephone Encounter (Signed)
PA has been DENIED due to:   Medication is not covered under plan

## 2023-02-24 NOTE — Telephone Encounter (Signed)
Kimberly Snow called in and said taking the amlodipine at night is not helping.  Her legs are swollen and hard which is concerning to her. Also, she has some swelling in her neck too.  She has discontinued taking the Amlodipine.

## 2023-02-25 MED ORDER — TRIAMTERENE-HCTZ 37.5-25 MG PO TABS
1.0000 | ORAL_TABLET | Freq: Every morning | ORAL | 5 refills | Status: DC
Start: 2023-02-25 — End: 2023-03-12

## 2023-02-25 NOTE — Addendum Note (Signed)
Addended by: Alysia Penna L on: 02/25/2023 11:58 AM   Modules accepted: Orders

## 2023-02-26 NOTE — Telephone Encounter (Signed)
Called patient and made her aware of medication change.  She wanted to know if she needs to continue the [facility] in the am as well.  Per Claris Gower, yes.

## 2023-02-26 NOTE — Telephone Encounter (Signed)
Called and made patient aware of denial

## 2023-03-12 ENCOUNTER — Encounter: Payer: Self-pay | Admitting: Nurse Practitioner

## 2023-03-12 ENCOUNTER — Ambulatory Visit: Payer: Commercial Managed Care - PPO | Admitting: Nurse Practitioner

## 2023-03-12 VITALS — BP 120/84 | HR 85 | Temp 97.6°F | Resp 16 | Ht 69.0 in | Wt 272.0 lb

## 2023-03-12 DIAGNOSIS — I10 Essential (primary) hypertension: Secondary | ICD-10-CM | POA: Diagnosis not present

## 2023-03-12 DIAGNOSIS — Z23 Encounter for immunization: Secondary | ICD-10-CM

## 2023-03-12 DIAGNOSIS — R8290 Unspecified abnormal findings in urine: Secondary | ICD-10-CM | POA: Diagnosis not present

## 2023-03-12 DIAGNOSIS — Z6841 Body Mass Index (BMI) 40.0 and over, adult: Secondary | ICD-10-CM | POA: Diagnosis not present

## 2023-03-12 LAB — POC URINALSYSI DIPSTICK (AUTOMATED)
Bilirubin, UA: NEGATIVE
Blood, UA: NEGATIVE
Glucose, UA: NEGATIVE
Ketones, UA: NEGATIVE
Leukocytes, UA: NEGATIVE
Nitrite, UA: NEGATIVE
Protein, UA: NEGATIVE
Spec Grav, UA: 1.015 (ref 1.010–1.025)
Urobilinogen, UA: 0.2 E.U./dL
pH, UA: 7.5 (ref 5.0–8.0)

## 2023-03-12 NOTE — Assessment & Plan Note (Signed)
Unable to tolerate maxzide-caused nausea and chest tightness after 2doses She decided to resume amlodipine 10mg  in PM She has continued use of benicar 20mg  in PM BP at goal BP Readings from Last 3 Encounters:  03/12/23 120/84  02/12/23 138/80  11/27/22 (!) 150/82    Maintain current med doses F/up in 34month

## 2023-03-12 NOTE — Patient Instructions (Addendum)
Maintain current medications. Urine looks normal. Sent it for culture to ensure no bacteria growth.

## 2023-03-12 NOTE — Progress Notes (Signed)
Established Patient Visit  Patient: Kimberly Snow   DOB: 06-Nov-1965   57 y.o. Female  MRN: [id number] Visit Date: 03/12/2023  Subjective:    Chief Complaint  Patient presents with   Hypertension    She stopped the Triamterene-hydrochlorothiazide.  It made her nauseas,and chest tightness     Weight Check   Urinary Tract Infection    Malodorus urine and cloudy   Urinary Tract Infection  This is a new problem. The current episode started in the past 7 days. The problem occurs every urination. The problem has been unchanged. The quality of the pain is described as burning. The pain is at a severity of 0/10. The patient is experiencing no pain. There has been no fever. She is Sexually active. There is No history of pyelonephritis. Associated symptoms include frequency. Pertinent negatives include no chills, discharge, flank pain, hematuria, hesitancy, nausea, possible pregnancy, sweats, urgency or vomiting. She has tried increased fluids for the symptoms. The treatment provided no relief. There is no history of recurrent UTIs.   Essential hypertension Unable to tolerate maxzide-caused nausea and chest tightness after 2doses She decided to resume amlodipine 10mg  in PM She has continued use of benicar 20mg  in PM BP at goal BP Readings from Last 3 Encounters:  03/12/23 120/84  02/12/23 138/80  11/27/22 (!) 150/82    Maintain current med doses F/up in 21month  Morbid obesity (HCC) Unable to afford GLP-1 injection She agreed to Healthy weight  clinic referral. Wt Readings from Last 3 Encounters:  03/12/23 272 lb (123.4 kg)  02/12/23 273 lb 6.4 oz (124 kg)  11/27/22 269 lb (122 kg)    Encouraged to maintain daily exercise and heart healthy diet    Reviewed medical, surgical, and social history today  Medications: Outpatient Medications Prior to Visit  Medication Sig   amLODipine (NORVASC) 10 MG tablet Take 10 mg by mouth at bedtime.   albuterol (PROVENTIL  HFA;VENTOLIN HFA) 108 (90 Base) MCG/ACT inhaler Inhale into the lungs.   meloxicam (MOBIC) 7.5 MG tablet Take 1 tablet (7.5 mg total) by mouth daily. With food   olmesartan (BENICAR) 20 MG tablet Take 1 tablet (20 mg total) by mouth daily.   pravastatin (PRAVACHOL) 20 MG tablet Take 1 tablet (20 mg total) by mouth 2 (two) times a week.   [DISCONTINUED] Semaglutide-Weight Management (WEGOVY) 0.25 MG/0.5ML SOAJ Inject 0.25 mg into the skin once a week.   [DISCONTINUED] triamterene-hydrochlorothiazide (MAXZIDE-25) 37.5-25 MG tablet Take 1 tablet by mouth every morning.   No facility-administered medications prior to visit.   Reviewed past medical and social history.   ROS per HPI above      Objective:  BP 120/84 (BP Location: Left Arm, Patient Position: Sitting, Cuff Size: Large)   Pulse 85   Temp 97.6 F (36.4 C) (Temporal)   Resp 16   Ht 5\' 9"  (1.753 m)   Wt 272 lb (123.4 kg)   LMP 07/30/2015   SpO2 95%   BMI 40.17 kg/m      Physical Exam Constitutional:      General: She is not in acute distress. Cardiovascular:     Rate and Rhythm: Normal rate.     Pulses: Normal pulses.  Pulmonary:     Effort: Pulmonary effort is normal.  Abdominal:     General: There is no distension.     Tenderness: There is no abdominal tenderness.  Musculoskeletal:  Right lower leg: No edema.     Left lower leg: No edema.  Neurological:     Mental Status: She is alert and oriented to person, place, and time.     Results for orders placed or performed in visit on 03/12/23  POCT Urinalysis Dipstick (Automated)  Result Value Ref Range   Color, UA light yellow    Clarity, UA Cloudy    Glucose, UA Negative Negative   Bilirubin, UA negative    Ketones, UA negative    Spec Grav, UA 1.015 1.010 - 1.025   Blood, UA neg    pH, UA 7.5 5.0 - 8.0   Protein, UA Negative Negative   Urobilinogen, UA 0.2 0.2 or 1.0 E.U./dL   Nitrite, UA negative    Leukocytes, UA Negative Negative       Assessment & Plan:    Problem List Items Addressed This Visit       Cardiovascular and Mediastinum   Essential hypertension    Unable to tolerate maxzide-caused nausea and chest tightness after 2doses She decided to resume amlodipine 10mg  in PM She has continued use of benicar 20mg  in PM BP at goal BP Readings from Last 3 Encounters:  03/12/23 120/84  02/12/23 138/80  11/27/22 (!) 150/82    Maintain current med doses F/up in 65month      Relevant Medications   amLODipine (NORVASC) 10 MG tablet     Other   Morbid obesity (HCC)    Unable to afford GLP-1 injection She agreed to Healthy weight  clinic referral. Wt Readings from Last 3 Encounters:  03/12/23 272 lb (123.4 kg)  02/12/23 273 lb 6.4 oz (124 kg)  11/27/22 269 lb (122 kg)    Encouraged to maintain daily exercise and heart healthy diet      Relevant Orders   Amb Ref to Medical Weight Management   Other Visit Diagnoses     Abnormal urine odor    -  Primary   Relevant Orders   Urine Culture   POCT Urinalysis Dipstick (Automated) (Completed)      Return in about 3 months (around 06/12/2023) for HTN, hyperlipidemia (fasting).     Alysia Penna, NP

## 2023-03-12 NOTE — Assessment & Plan Note (Signed)
Unable to afford GLP-1 injection She agreed to Healthy weight  clinic referral. Wt Readings from Last 3 Encounters:  03/12/23 272 lb (123.4 kg)  02/12/23 273 lb 6.4 oz (124 kg)  11/27/22 269 lb (122 kg)    Encouraged to maintain daily exercise and heart healthy diet

## 2023-03-13 LAB — URINE CULTURE
MICRO NUMBER:: 15130411
SPECIMEN QUALITY:: ADEQUATE

## 2023-03-27 ENCOUNTER — Encounter: Payer: Self-pay | Admitting: Nurse Practitioner

## 2023-03-27 ENCOUNTER — Ambulatory Visit: Payer: Commercial Managed Care - PPO | Admitting: Nurse Practitioner

## 2023-03-27 VITALS — BP 138/80 | HR 85 | Temp 98.1°F | Ht 69.0 in | Wt 272.0 lb

## 2023-03-27 DIAGNOSIS — S31109A Unspecified open wound of abdominal wall, unspecified quadrant without penetration into peritoneal cavity, initial encounter: Secondary | ICD-10-CM

## 2023-03-27 NOTE — Patient Instructions (Signed)
It was great to see you!  Keep the area clean with soap and water twice a day  Continue using neosporin and cover with a non-adherent gauze and paper tape.   I have placed a referral to the wound clinic, they will call to schedule.   Let's follow-up if your symptoms worsen or don't improve.   Take care,  Rodman Pickle, NP

## 2023-03-27 NOTE — Progress Notes (Signed)
   Acute Office Visit  Subjective:     Patient ID: Kimberly Snow, female    DOB: 1965/12/13, 57 y.o.   MRN: [id number]  Chief Complaint  Patient presents with   Blister    blister on lower abdominal for 3 weeks    HPI Patient is in today for blister on her lower abdomen for 3 weeks ago. She states that after the blister opened, it was very painful in the beginning and describes it as burning. She covered it with a bandaid and neosporin. When she took the bandaid off, it took some skin with it. Denies fevers. Denies drainage. She states it started to get better, then got worse again.   ROS See pertinent positives and negatives per HPI.     Objective:    BP 138/80 (BP Location: Left Arm)   Pulse 85   Temp 98.1 F (36.7 C) (Oral)   Ht 5\' 9"  (1.753 m)   Wt 272 lb (123.4 kg)   LMP 07/30/2015   SpO2 97%   BMI 40.17 kg/m  BP Readings from Last 3 Encounters:  03/27/23 138/80  03/12/23 120/84  02/12/23 138/80   Wt Readings from Last 3 Encounters:  03/27/23 272 lb (123.4 kg)  03/12/23 272 lb (123.4 kg)  02/12/23 273 lb 6.4 oz (124 kg)      Physical Exam Vitals and nursing note reviewed.  Constitutional:      General: She is not in acute distress.    Appearance: Normal appearance.  HENT:     Head: Normocephalic.  Eyes:     Conjunctiva/sclera: Conjunctivae normal.  Pulmonary:     Effort: Pulmonary effort is normal.  Musculoskeletal:     Cervical back: Normal range of motion.  Skin:    General: Skin is warm.     Comments: Wound to lower abdomen, covered in slough. No drainage noted. No redness surrounding. 2 skin tears noted near wound  Neurological:     General: No focal deficit present.     Mental Status: She is alert and oriented to person, place, and time.  Psychiatric:        Mood and Affect: Mood normal.        Behavior: Behavior normal.        Thought Content: Thought content normal.        Judgment: Judgment normal.      Assessment & Plan:    Problem List Items Addressed This Visit       Other   Wound of abdomen - Primary    Wound to abdomen that started as a blister, then non-healing since it opened. Unsure what caused the blister to appear initially. Continue to wash the area with soap and water daily and cover with neosporin, a non-adherent guaze, and paper tape. Will also place referral to wound clinic. Call the office if develop fever, drainage, or redness to surround skin.       Relevant Orders   Ambulatory referral to Wound Clinic    No orders of the defined types were placed in this encounter.   Return if symptoms worsen or fail to improve.  Gerre Scull, NP

## 2023-03-29 DIAGNOSIS — S31109A Unspecified open wound of abdominal wall, unspecified quadrant without penetration into peritoneal cavity, initial encounter: Secondary | ICD-10-CM | POA: Insufficient documentation

## 2023-03-29 NOTE — Assessment & Plan Note (Addendum)
Wound to abdomen that started as a blister, then non-healing since it opened. Unsure what caused the blister to appear initially. Continue to wash the area with soap and water daily and cover with neosporin, a non-adherent guaze, and paper tape. Will also place referral to wound clinic. Call the office if develop fever, drainage, or redness to surround skin.

## 2023-05-06 ENCOUNTER — Encounter: Payer: Self-pay | Admitting: Nurse Practitioner

## 2023-05-06 ENCOUNTER — Ambulatory Visit: Payer: Commercial Managed Care - PPO | Admitting: Nurse Practitioner

## 2023-05-06 ENCOUNTER — Ambulatory Visit (INDEPENDENT_AMBULATORY_CARE_PROVIDER_SITE_OTHER)
Admission: RE | Admit: 2023-05-06 | Discharge: 2023-05-06 | Disposition: A | Discharge status: Home / Self Care | Payer: Commercial Managed Care - PPO | Source: Ambulatory Visit | Attending: Nurse Practitioner | Admitting: Nurse Practitioner

## 2023-05-06 VITALS — BP 170/100 | HR 88 | Temp 97.2°F | Resp 16 | Ht 69.0 in | Wt 268.0 lb

## 2023-05-06 DIAGNOSIS — E876 Hypokalemia: Secondary | ICD-10-CM

## 2023-05-06 DIAGNOSIS — R0781 Pleurodynia: Secondary | ICD-10-CM

## 2023-05-06 DIAGNOSIS — E559 Vitamin D deficiency, unspecified: Secondary | ICD-10-CM | POA: Diagnosis not present

## 2023-05-06 DIAGNOSIS — R5383 Other fatigue: Secondary | ICD-10-CM

## 2023-05-06 DIAGNOSIS — R5381 Other malaise: Secondary | ICD-10-CM

## 2023-05-06 DIAGNOSIS — F419 Anxiety disorder, unspecified: Secondary | ICD-10-CM | POA: Diagnosis not present

## 2023-05-06 DIAGNOSIS — I10 Essential (primary) hypertension: Secondary | ICD-10-CM

## 2023-05-06 LAB — CBC WITH DIFFERENTIAL/PLATELET
Basophils Absolute: 0 10*3/uL (ref 0.0–0.1)
Basophils Relative: 0.5 % (ref 0.0–3.0)
Eosinophils Absolute: 0.1 10*3/uL (ref 0.0–0.7)
Eosinophils Relative: 1.1 % (ref 0.0–5.0)
HCT: 38.3 % (ref 36.0–46.0)
Hemoglobin: 12.1 g/dL (ref 12.0–15.0)
Lymphocytes Relative: 28.8 % (ref 12.0–46.0)
Lymphs Abs: 2 10*3/uL (ref 0.7–4.0)
MCHC: 31.6 g/dL (ref 30.0–36.0)
MCV: 82 fl (ref 78.0–100.0)
Monocytes Absolute: 0.6 10*3/uL (ref 0.1–1.0)
Monocytes Relative: 8.5 % (ref 3.0–12.0)
Neutro Abs: 4.2 10*3/uL (ref 1.4–7.7)
Neutrophils Relative %: 61.1 % (ref 43.0–77.0)
Platelets: 296 10*3/uL (ref 150.0–400.0)
RBC: 4.68 Mil/uL (ref 3.87–5.11)
RDW: 14.2 % (ref 11.5–15.5)
WBC: 6.9 10*3/uL (ref 4.0–10.5)

## 2023-05-06 LAB — BASIC METABOLIC PANEL
BUN: 9 mg/dL (ref 6–23)
CO2: 28 mEq/L (ref 19–32)
Calcium: 9 mg/dL (ref 8.4–10.5)
Chloride: 102 mEq/L (ref 96–112)
Creatinine, Ser: 0.93 mg/dL (ref 0.40–1.20)
GFR: 68.63 mL/min (ref >60.00)
Glucose, Bld: 104 mg/dL — ABNORMAL HIGH (ref 70–99)
Potassium: 3.3 mEq/L — ABNORMAL LOW (ref 3.5–5.1)
Sodium: 141 mEq/L (ref 135–145)

## 2023-05-06 LAB — CK: Total CK: 140 U/L (ref 7–177)

## 2023-05-06 LAB — VITAMIN D 25 HYDROXY (VIT D DEFICIENCY, FRACTURES): VITD: 20.99 ng/mL — ABNORMAL LOW (ref 30.00–100.00)

## 2023-05-06 LAB — POC COVID19 BINAXNOW: SARS Coronavirus 2 Ag: NEGATIVE

## 2023-05-06 MED ORDER — POTASSIUM CHLORIDE CRYS ER 20 MEQ PO TBCR
20.0000 meq | EXTENDED_RELEASE_TABLET | Freq: Two times a day (BID) | ORAL | 0 refills | Status: AC
Start: 2023-05-06 — End: ?

## 2023-05-06 MED ORDER — VITAMIN D (ERGOCALCIFEROL) 1.25 MG (50000 UNIT) PO CAPS
50000.0000 [IU] | ORAL_CAPSULE | ORAL | 0 refills | Status: AC
Start: 2023-05-06 — End: ?

## 2023-05-06 NOTE — Addendum Note (Signed)
Addended by: Michaela Corner on: 05/06/2023 02:39 PM   Modules accepted: Orders

## 2023-05-06 NOTE — Progress Notes (Signed)
Established Patient Visit  Patient: Kimberly Snow   DOB: November 19, 1965   57 y.o. Female  MRN: [id number] Visit Date: 05/06/2023  Subjective:    Chief Complaint  Patient presents with   Pain    Lower extremity - muscle pain, feels week and always tired.  Feels it may be a vitamin deficiency. Started last 04/30/2023   HPI Essential hypertension Elevated BP due to med non compliance and increased stress BP Readings from Last 3 Encounters:  05/06/23 (!) 170/100  03/27/23 138/80  03/12/23 120/84    Advised to take olmesartan in AM and amlodipine in PM, maintain DASh diet F/up in 1week  Anxiety Worsening anxiety due to son's poor health. Somatic symptoms: malaise, anorexia and fatigue. Normal THYROID, CK, ESR, CRP, BMP, and UA Check Vit. D and CBC today Entered referral to psychology  BP Readings from Last 3 Encounters:  05/06/23 (!) 170/100  03/27/23 138/80  03/12/23 120/84    Reviewed medical, surgical, and social history today  Medications: Outpatient Medications Prior to Visit  Medication Sig   amLODipine (NORVASC) 10 MG tablet Take 10 mg by mouth at bedtime.   meloxicam (MOBIC) 7.5 MG tablet Take 1 tablet (7.5 mg total) by mouth daily. With food   olmesartan (BENICAR) 20 MG tablet Take 1 tablet (20 mg total) by mouth daily.   albuterol (PROVENTIL HFA;VENTOLIN HFA) 108 (90 Base) MCG/ACT inhaler Inhale into the lungs. (Patient not taking: Reported on 03/27/2023)   pravastatin (PRAVACHOL) 20 MG tablet Take 1 tablet (20 mg total) by mouth 2 (two) times a week. (Patient not taking: Reported on 03/27/2023)   No facility-administered medications prior to visit.   Reviewed past medical and social history.   ROS per HPI above  Last metabolic panel Lab Results  Component Value Date   GLUCOSE 114 (H) 11/12/2022   NA 141 11/12/2022   K 3.6 11/12/2022   CL 103 11/12/2022   CO2 31 11/12/2022   BUN 12 11/12/2022   CREATININE 0.84 11/12/2022   GFR 77.81  11/12/2022   CALCIUM 9.1 11/12/2022   PHOS 2.7 10/15/2022   PROT 7.5 02/26/2019   ALBUMIN 4.0 10/15/2022   BILITOT 0.5 02/26/2019   ALKPHOS 57 08/05/2017   AST 28 02/26/2019   ALT 24 02/26/2019   ANIONGAP 6 05/13/2016   Last hemoglobin A1c Lab Results  Component Value Date   HGBA1C 6.1 (A) 02/12/2023   Last thyroid functions Lab Results  Component Value Date   TSH 1.55 10/15/2022       Objective:  BP (!) 170/100   Pulse 88   Temp (!) 97.2 F (36.2 C) (Temporal)   Resp 16   Ht 5\' 9"  (1.753 m)   Wt 268 lb (121.6 kg)   LMP 07/30/2015   SpO2 99%   BMI 39.58 kg/m      Physical Exam Vitals and nursing note reviewed.  Constitutional:      General: She is not in acute distress. Cardiovascular:     Rate and Rhythm: Normal rate and regular rhythm.     Pulses: Normal pulses.     Heart sounds: Normal heart sounds.  Pulmonary:     Effort: Pulmonary effort is normal.     Breath sounds: Normal breath sounds.  Lymphadenopathy:     Cervical: No cervical adenopathy.  Neurological:     Mental Status: She is alert and oriented to person, place, and time.  Results for orders placed or performed in visit on 05/06/23  POC COVID-19  Result Value Ref Range   SARS Coronavirus 2 Ag Negative Negative      Assessment & Plan:    Problem List Items Addressed This Visit     Anxiety    Worsening anxiety due to son's poor health. Somatic symptoms: malaise, anorexia and fatigue. Normal THYROID, CK, ESR, CRP, BMP, and UA Check Vit. D and CBC today Entered referral to psychology       Relevant Orders   Ambulatory referral to Psychology   Essential hypertension    Elevated BP due to med non compliance and increased stress BP Readings from Last 3 Encounters:  05/06/23 (!) 170/100  03/27/23 138/80  03/12/23 120/84    Advised to take olmesartan in AM and amlodipine in PM, maintain DASh diet F/up in 1week      Other Visit Diagnoses     Malaise and fatigue    -   Primary   Relevant Orders   CK   Basic metabolic panel   DG Chest 2 View   CBC with Differential/Platelet   POC COVID-19 (Completed)   Pleuritic pain       Relevant Orders   DG Chest 2 View   Vitamin D insufficiency       Relevant Orders   VITAMIN D 25 Hydroxy (Vit-D Deficiency, Fractures)      Return in about 9 days (around 05/15/2023) for HTN.     Alysia Penna, NP

## 2023-05-06 NOTE — Assessment & Plan Note (Signed)
Elevated BP due to med non compliance and increased stress BP Readings from Last 3 Encounters:  05/06/23 (!) 170/100  03/27/23 138/80  03/12/23 120/84    Advised to take olmesartan in AM and amlodipine in PM, maintain DASh diet F/up in 1week

## 2023-05-06 NOTE — Assessment & Plan Note (Addendum)
Worsening anxiety due to son's poor health. Somatic symptoms: malaise, anorexia and fatigue.(Waxing and waning) Normal THYROID, CK, ESR, CRP, BMP, and UA Check Vit. D and CBC today Entered referral to psychology

## 2023-05-06 NOTE — Patient Instructions (Signed)
Go to lab Go to [address] for CXR Take BP meds this AM Take olmesartan in AM and amlodipine in PM Schedule appointment with psychology

## 2023-05-08 ENCOUNTER — Other Ambulatory Visit: Payer: Self-pay | Admitting: Nurse Practitioner

## 2023-05-08 DIAGNOSIS — I10 Essential (primary) hypertension: Secondary | ICD-10-CM

## 2023-06-09 ENCOUNTER — Other Ambulatory Visit: Payer: Self-pay | Admitting: Nurse Practitioner

## 2023-06-09 DIAGNOSIS — I10 Essential (primary) hypertension: Secondary | ICD-10-CM

## 2023-06-20 ENCOUNTER — Other Ambulatory Visit: Payer: Self-pay | Admitting: Nurse Practitioner

## 2023-06-20 DIAGNOSIS — Z1212 Encounter for screening for malignant neoplasm of rectum: Secondary | ICD-10-CM

## 2023-06-20 DIAGNOSIS — Z1211 Encounter for screening for malignant neoplasm of colon: Secondary | ICD-10-CM

## 2023-07-23 ENCOUNTER — Telehealth: Payer: Self-pay | Admitting: Nurse Practitioner

## 2023-07-23 DIAGNOSIS — Z1211 Encounter for screening for malignant neoplasm of colon: Secondary | ICD-10-CM

## 2023-07-23 NOTE — Telephone Encounter (Signed)
Pt is requesting a referral for a colonoscopy instead of a cologard.  Please call to let her know when done.

## 2023-07-23 NOTE — Addendum Note (Signed)
Addended by: Michaela Corner on: 07/23/2023 03:38 PM   Modules accepted: Orders

## 2023-07-24 NOTE — Telephone Encounter (Signed)
Spoke to patient and informed her that GI referral for colonoscopy was placed. Patient verbalized understanding

## 2023-08-06 ENCOUNTER — Other Ambulatory Visit: Payer: Self-pay | Admitting: Nurse Practitioner

## 2023-08-06 DIAGNOSIS — I10 Essential (primary) hypertension: Secondary | ICD-10-CM

## 2023-08-21 ENCOUNTER — Encounter: Payer: Commercial Managed Care - PPO | Admitting: Nurse Practitioner

## 2023-09-04 ENCOUNTER — Encounter: Payer: Commercial Managed Care - PPO | Admitting: Nurse Practitioner

## 2023-10-06 ENCOUNTER — Ambulatory Visit: Payer: Commercial Managed Care - PPO | Admitting: Internal Medicine

## 2023-10-06 ENCOUNTER — Other Ambulatory Visit: Payer: Self-pay | Admitting: Nurse Practitioner

## 2023-10-06 ENCOUNTER — Other Ambulatory Visit: Payer: Commercial Managed Care - PPO

## 2023-10-06 ENCOUNTER — Encounter: Payer: Self-pay | Admitting: Internal Medicine

## 2023-10-06 VITALS — BP 130/82 | HR 90 | Temp 97.7°F | Ht 69.0 in | Wt 273.0 lb

## 2023-10-06 DIAGNOSIS — M545 Low back pain, unspecified: Secondary | ICD-10-CM | POA: Diagnosis not present

## 2023-10-06 DIAGNOSIS — I10 Essential (primary) hypertension: Secondary | ICD-10-CM

## 2023-10-06 DIAGNOSIS — R351 Nocturia: Secondary | ICD-10-CM

## 2023-10-06 LAB — POCT GLYCOSYLATED HEMOGLOBIN (HGB A1C): Hemoglobin A1C: 6 % — AB (ref 4.0–5.6)

## 2023-10-06 MED ORDER — METHOCARBAMOL 500 MG PO TABS: 500.0000 mg | ORAL_TABLET | Freq: Three times a day (TID) | ORAL | 0 refills | Status: AC | PRN

## 2023-10-06 MED ORDER — OLMESARTAN MEDOXOMIL 20 MG PO TABS: 20.0000 mg | ORAL_TABLET | Freq: Every day | ORAL | 0 refills | Status: DC

## 2023-10-06 NOTE — Patient Instructions (Signed)
BACK PAIN:  Meloxicam 1 tablet once daily for 2 weeks  Use muscle relaxant as prescribed  Heat pad as needed  Go for xray  - I am waiting on urine sample results.

## 2023-10-06 NOTE — Telephone Encounter (Signed)
Copied from CRM [id number]. Topic: Clinical - Medication Refill >> Oct 06, 2023  8:16 AM Efraim Kaufmann C wrote: Most Recent Primary Care Visit:  Provider: Anne Ng  Department: [facility]  Visit Type: OFFICE VISIT  Date: 05/06/2023  Medication: olmesartan (BENICAR) 20 MG tablet  Has the patient contacted their pharmacy? Yes (Agent: If no, request that the patient contact the pharmacy for the refill. If patient does not wish to contact the pharmacy document the reason why and proceed with request.) (Agent: If yes, when and what did the pharmacy advise?)  Is this the correct pharmacy for this prescription? Yes If no, delete pharmacy and type the correct one.  This is the patient's preferred pharmacy:  [id number] - [place], [place] - [address] AT [facility] OF [address] [place] [address] Phone: [phone] Fax: [phone]    Has the prescription been filled recently? No  Is the patient out of the medication? Yes  Has the patient been seen for an appointment in the last year OR does the patient have an upcoming appointment? Yes  Can we respond through MyChart? Yes  Agent: Please be advised that Rx refills may take up to 3 business days. We ask that you follow-up with your pharmacy.

## 2023-10-06 NOTE — Progress Notes (Signed)
[facility] [address] [place] [address] Dept: [phone] Dept Fax: [phone]  Acute Care Office Visit  Subjective:   Kimberly Snow [facility] 05/27/1966 10/06/2023  Chief Complaint  Patient presents with   Back Pain    HPI: Discussed the use of AI scribe software for clinical note transcription with the patient, who gave verbal consent to proceed.  History of Present Illness   The patient, with a history of hypertension, presents with lower back pain and nocturia. The back pain, which spans the entire lower back, started two months ago after lifting a walker. The patient initially attributed the pain to this incident. She has not sought treatment for the pain, but has found some relief with Tylenol PM and heating pad at night. Pain worsens with movement.   In addition to the back pain, the patient has been experiencing nocturia and excessive thirst at night. She reports waking up three times a night to urinate and drink water. This is a new symptom for the patient. She also reports numbness in both legs, more so in the left leg, which has been ongoing for a while. She also reports a significant weight gain of 53 pounds over the past five years. She reports her husband has reported she does snore.       The following portions of the patient's history were reviewed and updated as appropriate: past medical history, past surgical history, family history, social history, allergies, medications, and problem list.   Patient Active Problem List   Diagnosis Date Noted   Wound of abdomen 03/29/2023   Dysmenorrhea 10/15/2022   Bilateral leg pain 05/13/2022   Prediabetes 11/01/2021   Numbness and tingling of left lower extremity 10/01/2021   Mild intermittent asthma without complication 05/14/2021   History of syphilis 03/27/2018   Hematuria 03/23/2018   Pain in surgical scar 03/23/2018   Anxiety 08/05/2017   Essential  hypertension 11/07/2016   Mixed hyperlipidemia 11/07/2016   Pure hypercholesterolemia 11/07/2016   Morbid obesity (HCC) 11/07/2016   Dyspareunia, female 10/09/2016   S/P abdominal hysterectomy 05/22/2016   Constipation 05/22/2016   Panic attacks 05/22/2016   Past Medical History:  Diagnosis Date   Abnormal vaginal bleeding    Anemia    Anemia 09/08/2015   9.4 Formatting of this note might be different from the original. Last Assessment & Plan: Formatting of this note might be different from the original. improved Continue ferrous sulfate or Ferralet once a day. Encourage iron rich diet.   Anxiety    Blockage of coronary artery of heart (HCC)    was supposed to have a stent placed, but left hospital   CAD- ?  11/07/2016   Fibroids    Menorrhagia 05/12/2016   Uterine leiomyoma 09/08/2015   Past Surgical History:  Procedure Laterality Date   ABDOMINAL HYSTERECTOMY  05/13/2017   ovaries still present.   HYSTERECTOMY ABDOMINAL WITH SALPINGECTOMY Bilateral 05/13/2016   Procedure: HYSTERECTOMY ABDOMINAL WITH SALPINGECTOMY with iud removal;  Surgeon: Hal Morales, MD;  Location: [facility];  Service: Gynecology;  Laterality: Bilateral;  3 Hours   TUBAL LIGATION     WISDOM TOOTH EXTRACTION     Family History  Problem Relation Age of Onset   Diabetes Mother    Hypertension Mother    Alcohol abuse Father    Cancer Father        liver   Hypertension Brother    Heart disease Paternal Uncle    Hypertension  Paternal Uncle    Hyperlipidemia Paternal Uncle     Current Outpatient Medications:    albuterol (PROVENTIL HFA;VENTOLIN HFA) 108 (90 Base) MCG/ACT inhaler, Inhale into the lungs., Disp: , Rfl:    amLODipine (NORVASC) 10 MG tablet, Take 10 mg by mouth at bedtime., Disp: , Rfl:    meloxicam (MOBIC) 7.5 MG tablet, Take 1 tablet (7.5 mg total) by mouth daily. With food, Disp: 30 tablet, Rfl: 5   methocarbamol (ROBAXIN) 500 MG tablet, Take 1 tablet (500 mg total) by mouth every 8  (eight) hours as needed for muscle spasms., Disp: 30 tablet, Rfl: 0   olmesartan (BENICAR) 20 MG tablet, Take 1 tablet (20 mg total) by mouth daily., Disp: 30 tablet, Rfl: 0   potassium chloride SA (KLOR-CON M) 20 MEQ tablet, Take 1 tablet (20 mEq total) by mouth 2 (two) times daily., Disp: 10 tablet, Rfl: 0   pravastatin (PRAVACHOL) 20 MG tablet, Take 1 tablet (20 mg total) by mouth 2 (two) times a week., Disp: 8 tablet, Rfl: 5   Vitamin D, Ergocalciferol, (DRISDOL) 1.25 MG (50000 UNIT) CAPS capsule, Take 1 capsule (50,000 Units total) by mouth every 7 (seven) days., Disp: 12 capsule, Rfl: 0 Allergies  Allergen Reactions   Medroxyprogesterone Anxiety and Other (See Comments)   Provera  [Medroxyprogesterone Acetate] Other (See Comments)   Codeine Nausea And Vomiting   Lisinopril Swelling    Lip swelling Lip swelling Lip swelling   Oxycodone-Acetaminophen Other (See Comments)    Other reaction(s): Mental Status Changes (intolerance)   Triamterene-Hctz Nausea Only and Other (See Comments)    Chest tightness and nausea      ROS: A complete ROS was performed with pertinent positives/negatives noted in the HPI. The remainder of the ROS are negative.    Objective:   Today's Vitals   10/06/23 1525  BP: 130/82  Pulse: 90  Temp: 97.7 F (36.5 C)  TempSrc: Temporal  SpO2: 98%  Weight: 273 lb (123.8 kg)  Height: 5\' 9"  (1.753 m)    GENERAL: Well-appearing, in NAD. Well nourished.  SKIN: Pink, warm and dry. No rash, lesion, ulceration, or ecchymoses.  NECK: Trachea midline. Full ROM w/o pain or tenderness. No lymphadenopathy.  RESPIRATORY: Chest wall symmetrical. Respirations even and non-labored. Breath sounds clear to auscultation bilaterally.  CARDIAC: S1, S2 present, regular rate and rhythm. Peripheral pulses 2+ bilaterally.  MSK: Muscle tone and strength appropriate for age. Joints w/o tenderness, redness, or swelling. No spinal tenderness, no obvious deformities.  EXTREMITIES:  Without clubbing, cyanosis, or edema.  NEUROLOGIC: No motor or sensory deficits. Steady, even gait.  PSYCH/MENTAL STATUS: Alert, oriented x 3. Cooperative, appropriate mood and affect.    Results for orders placed or performed in visit on 10/06/23  POCT glycosylated hemoglobin (Hb A1C)  Result Value Ref Range   Hemoglobin A1C 6.0 (A) 4.0 - 5.6 %   HbA1c POC (<> result, manual entry)     HbA1c, POC (prediabetic range)     HbA1c, POC (controlled diabetic range)        Assessment & Plan:  Assessment and Plan    Lower Back Pain Diffuse lumbar pain for 2 months, initially thought to be related to lifting a wheelchair. No urinary symptoms, fever, or chills. Pain is relieved with Tylenol PM and heating pad. -Order lumbar spine X-ray. -Prescribe Meloxicam 7.5mg  once daily for 2 weeks and Robaxin 500mg  every 8 hours as needed. - continue heating pad as needed  Nocturia Increased thirst and urination at  night for an unspecified duration. No dysuria, hematuria, or fever. -Collect urine sample for urinalysis and reflex culture. -Discussed referral for sleep study, but patient declined at this time. - A1C in the prediabetes range as of last year. Check POC A1C today.    Follow-up if symptoms worsen or fail to improve.       Meds ordered this encounter  Medications   methocarbamol (ROBAXIN) 500 MG tablet    Sig: Take 1 tablet (500 mg total) by mouth every 8 (eight) hours as needed for muscle spasms.    Dispense:  30 tablet    Refill:  0    Supervising Provider:   Garnette Gunner [[id number]]   Orders Placed This Encounter  Procedures   DG Lumbar Spine 2-3 Views    Standing Status:   Future    Expiration Date:   04/04/2024    Reason for Exam (SYMPTOM  OR DIAGNOSIS REQUIRED):   lower back pain x 2 months    Is the patient pregnant?:   No    Preferred imaging location?:   Internal   Urinalysis w microscopic + reflex cultur   POCT glycosylated hemoglobin (Hb A1C)   Lab Orders          Urinalysis w microscopic + reflex cultur         POCT glycosylated hemoglobin (Hb A1C)     No images are attached to the encounter or orders placed in the encounter.  Return if symptoms worsen or fail to improve.   Salvatore Decent, FNP

## 2023-10-08 LAB — URINALYSIS W MICROSCOPIC + REFLEX CULTURE
Bilirubin Urine: NEGATIVE
Glucose, UA: NEGATIVE
Ketones, ur: NEGATIVE
Leukocyte Esterase: NEGATIVE
Nitrites, Initial: NEGATIVE
Protein, ur: NEGATIVE
Specific Gravity, Urine: 1.022 (ref 1.001–1.035)
pH: 5.5 (ref 5.0–8.0)

## 2023-10-08 LAB — NO CULTURE INDICATED

## 2023-10-09 ENCOUNTER — Ambulatory Visit: Payer: Commercial Managed Care - PPO | Admitting: Nurse Practitioner

## 2023-10-09 ENCOUNTER — Encounter: Payer: Self-pay | Admitting: Internal Medicine

## 2023-10-09 ENCOUNTER — Telehealth: Payer: Self-pay | Admitting: Nurse Practitioner

## 2023-10-09 NOTE — Telephone Encounter (Signed)
Please let me know what to do with her.

## 2023-10-09 NOTE — Telephone Encounter (Signed)
Copied from CRM [id number]. Topic: Appointments - Scheduling Inquiry for Clinic >> Oct 09, 2023 12:18 PM Alvino Blood C wrote: Reason for CRM:  Patient has a scheduled appointment but has a family emergency. She wants to know would she still be penalized for  the short notice cancellation? and she would also like to know could the payment for today's appointment go towards her appointment on 10/20/2023. Patient would like a call back at [phone] (M)

## 2023-10-09 NOTE — Telephone Encounter (Unsigned)
Copied from CRM [id number]. Topic: Appointments - Scheduling Inquiry for Clinic >> Oct 09, 2023 12:18 PM Alvino Blood C wrote: Reason for CRM:  Patient has a scheduled appointment but has a family emergency. She wants to know would she still be penalized for  the short notice cancellation? and she would also like to know could the payment for today's appointment go towards her appointment on 10/20/2023. Patient would like a call back at [phone] (M)

## 2023-10-10 ENCOUNTER — Other Ambulatory Visit: Payer: Commercial Managed Care - PPO

## 2023-10-10 DIAGNOSIS — R350 Frequency of micturition: Secondary | ICD-10-CM

## 2023-10-10 NOTE — Telephone Encounter (Signed)
10/09/2023 same day cancel/family ER  1st missed visit, letter sent via [facility]

## 2023-10-10 NOTE — Telephone Encounter (Signed)
It would count as a same day cancellation but is her first missed visit if she cannot come. There will not be a fee for missing the visit.  In the future, if she is unable to keep her appointment please call 24 hours in advance to avoid being marked as a late cancellation. It is also important to arrive on time for appointments to allow appropriate time with your provider to meet your care needs.  We understand unexpected events come up. Our policy is Three late arrivals, late cancellations, or no show appointments may result in being dismissed from our office.

## 2023-10-11 LAB — URINE CULTURE
MICRO NUMBER:: 15994676
SPECIMEN QUALITY:: ADEQUATE

## 2023-10-11 LAB — EXTRA URINE SPECIMEN

## 2023-10-27 ENCOUNTER — Encounter: Payer: Commercial Managed Care - PPO | Admitting: Nurse Practitioner

## 2023-10-28 ENCOUNTER — Telehealth: Payer: Self-pay | Admitting: Nurse Practitioner

## 2023-10-28 NOTE — Telephone Encounter (Signed)
10/09/2023 same day cancel/family ER 10/27/2023 no show  Final warning letter sent via mail and mychart

## 2023-10-30 ENCOUNTER — Other Ambulatory Visit: Payer: Self-pay | Admitting: Nurse Practitioner

## 2023-10-30 DIAGNOSIS — I10 Essential (primary) hypertension: Secondary | ICD-10-CM

## 2023-10-31 NOTE — Telephone Encounter (Signed)
Requesting: OLMESARTAN MEDOXOMIL 20MG  TABLETS  Last Visit: 05/06/2023, 10/06/2023 Next Visit: Visit date not found Last Refill: 10/06/2023  Please Advise   Patient is due for an appointment

## 2023-11-26 ENCOUNTER — Other Ambulatory Visit: Payer: Self-pay | Admitting: Nurse Practitioner

## 2023-11-26 DIAGNOSIS — I10 Essential (primary) hypertension: Secondary | ICD-10-CM

## 2023-11-26 NOTE — Telephone Encounter (Signed)
 Copied from CRM [id number]. Topic: Clinical - Medication Refill >> Nov 26, 2023  1:30 PM Theodis Sato wrote: Most Recent Primary Care Visit:  Provider: [facility]  Department: [facility]  Visit Type: LAB VISIT  Date: 10/10/2023  Medication: olmesartan (BENICAR) 20 MG tablet  Has the patient contacted their pharmacy? Yes, out of refills and expired. (Agent: If no, request that the patient contact the pharmacy for the refill. If patient does not wish to contact the pharmacy document the reason why and proceed with request.) (Agent: If yes, when and what did the pharmacy advise?)  Is this the correct pharmacy for this prescription? Yes If no, delete pharmacy and type the correct one.  This is the patient's preferred pharmacy:  [facility] #[id number] - [place], [place] - [address] AT [facility] [address] [place] [address] Phone: [phone] Fax: [phone]    Has the prescription been filled recently? Yes  Is the patient out of the medication? Yes  Has the patient been seen for an appointment in the last year OR does the patient have an upcoming appointment? Yes  Can we respond through MyChart? Yes  Agent: Please be advised that Rx refills may take up to 3 business days. We ask that you follow-up with your pharmacy.

## 2023-11-27 ENCOUNTER — Other Ambulatory Visit: Payer: Self-pay | Admitting: Nurse Practitioner

## 2023-11-27 DIAGNOSIS — I10 Essential (primary) hypertension: Secondary | ICD-10-CM

## 2023-11-27 NOTE — Telephone Encounter (Signed)
 Copied from CRM [id number]. Topic: Clinical - Medication Refill >> Nov 27, 2023 12:38 PM Alcus Dad wrote: Most Recent Primary Care Visit:  Provider: [facility]  Department: [facility]  Visit Type: LAB VISIT  Date: 10/10/2023  Medication: olmesartan (BENICAR) 20 MG tablet  Has the patient contacted their pharmacy? Yes (Agent: If no, request that the patient contact the pharmacy for the refill. If patient does not wish to contact the pharmacy document the reason why and proceed with request.) (Agent: If yes, when and what did the pharmacy advise?)  Is this the correct pharmacy for this prescription? Yes If no, delete pharmacy and type the correct one.  This is the patient's preferred pharmacy:  [facility] #[id number] - [place], [place] - [address] AT [facility] OF [address] [place] [address] Phone: [phone] Fax: [phone]  [facility] #[id number] [place], [place] - [address] AT [facility] OF [address] [place] [address] Phone: [phone] Fax: [phone]   Has the prescription been filled recently? No  Is the patient out of the medication? Yes  Has the patient been seen for an appointment in the last year OR does the patient have an upcoming appointment? Yes  Can we respond through MyChart? Yes  Agent: Please be advised that Rx refills may take up to 3 business days. We ask that you follow-up with your pharmacy.

## 2023-12-10 ENCOUNTER — Other Ambulatory Visit: Payer: Self-pay

## 2023-12-10 DIAGNOSIS — I10 Essential (primary) hypertension: Secondary | ICD-10-CM

## 2023-12-10 MED ORDER — OLMESARTAN MEDOXOMIL 20 MG PO TABS: 20.0000 mg | ORAL_TABLET | Freq: Every day | ORAL | 0 refills | Status: AC

## 2023-12-10 NOTE — Telephone Encounter (Signed)
 Called patient and got her scheduled to see [facility] on 12/22/23. Patient was informed that she will be given enough of the medication to get her to the upcoming appointment. I also informed her that if she cancels the appointment the day of she will be dismissed from the practice. Patient was not happy about being informed of this especially since she has told someone before when she has cancelled her past appointments that she was out of town taking care of her mother who has had her legs amputated at different times in the past 1-2 months. Jhonnie Garner asked what pharmacy to send it to patient stated to send to whatever pharmacy in [place] then asked why send it now she has been without it for almost a month. I informed her if she is not aware of which location I can type the name/location in our system and let her decide which is close by. She also stated that she will be speaking with Claris Gower at upcoming appointment because she is not happy about being told she will be dismissed and that Claris Gower knows her that she doesn't just no show. I thanked her for taking my call and that I look forward to seeing her on 12/22/23 at 11:40 AM.

## 2023-12-22 ENCOUNTER — Encounter: Payer: Self-pay | Admitting: Nurse Practitioner

## 2023-12-22 ENCOUNTER — Ambulatory Visit: Admitting: Nurse Practitioner

## 2023-12-22 VITALS — BP 166/80 | HR 90 | Temp 97.6°F | Ht 69.5 in | Wt 266.8 lb

## 2023-12-22 DIAGNOSIS — I10 Essential (primary) hypertension: Secondary | ICD-10-CM

## 2023-12-22 NOTE — Assessment & Plan Note (Addendum)
 Kimberly Snow states she has not had olmesartan and amlodipine prescription x 2months. States she was not able to maintain previous appointment due to mother's poor health and hospitalization in [place]. She states she is does not like being told she will be dismissed since she did not maintain previous appts. She states does not need me to send any medication refills. She states she will be establishing her care with another provider and will get refills from that provider. I expressed my condolences on her mother's demise. I tried to review previous med refills with Kimberly Snow, but she interrupted me stating "it does not matter, she is taking her care somewhere else. She does not want anything from a provider who lets go without her medications". She then walked out of the exam room.  Upon review of her medication list: olmesartan and amlodipine prescriptions were sent 02/12/2023, #90 with 3refills. I called [facility] to verify refills. I was informed by the pharmacy assistant that she has pending refills on both medications (#90tabs each).

## 2023-12-22 NOTE — Progress Notes (Signed)
 Established Patient Visit  Patient: Kimberly Snow   DOB: [birth year]   58 y.o. Female  MRN: [id number] Visit Date: 12/22/2023  Subjective:    Chief Complaint  Patient presents with   Follow-up   HPI Essential hypertension Kimberly Snow states she has not had olmesartan and amlodipine prescription x 2months. States she was not able to maintain previous appointment due to mother's poor health and hospitalization in [place]. She states she is does not like being told she will be dismissed since she did not maintain previous appts. She states does not need me to send any medication refills. She states she will be establishing her care with another provider and will get refills from that provider. I expressed my condolences on her mother's demise. I tried to review previous med refills with Kimberly Snow, but she interrupted me stating "it does not matter, she is taking her care somewhere else. She does not want anything from a provider who lets go without her medications". She then walked out of the exam room.  Upon review of her medication list: olmesartan and amlodipine prescriptions were sent 02/12/2023, #90 with 3refills. I called [facility] to verify refills. I was informed by the pharmacy assistant that she has pending refills on both medications (#90tabs each).  Reviewed medical, surgical, and social history today  Medications: Outpatient Medications Prior to Visit  Medication Sig   albuterol (PROVENTIL HFA;VENTOLIN HFA) 108 (90 Base) MCG/ACT inhaler Inhale into the lungs. (Patient not taking: Reported on 12/22/2023)   amLODipine (NORVASC) 10 MG tablet Take 10 mg by mouth at bedtime. (Patient not taking: Reported on 12/22/2023)   meloxicam (MOBIC) 7.5 MG tablet Take 1 tablet (7.5 mg total) by mouth daily. With food (Patient not taking: Reported on 12/22/2023)   methocarbamol (ROBAXIN) 500 MG tablet Take 1 tablet (500 mg total) by mouth every 8 (eight) hours as needed for muscle  spasms. (Patient not taking: Reported on 12/22/2023)   olmesartan (BENICAR) 20 MG tablet Take 1 tablet (20 mg total) by mouth daily for 12 days. (Patient not taking: Reported on 12/22/2023)   potassium chloride SA (KLOR-CON M) 20 MEQ tablet Take 1 tablet (20 mEq total) by mouth 2 (two) times daily. (Patient not taking: Reported on 12/22/2023)   pravastatin (PRAVACHOL) 20 MG tablet Take 1 tablet (20 mg total) by mouth 2 (two) times a week. (Patient not taking: Reported on 12/22/2023)   Vitamin D, Ergocalciferol, (DRISDOL) 1.25 MG (50000 UNIT) CAPS capsule Take 1 capsule (50,000 Units total) by mouth every 7 (seven) days. (Patient not taking: Reported on 12/22/2023)   No facility-administered medications prior to visit.   Reviewed past medical and social history.   ROS per HPI above      Objective:  BP (!) 166/80 (BP Location: Right Arm, Patient Position: Sitting, Cuff Size: Large)   Pulse 90   Temp 97.6 F (36.4 C) (Temporal)   Ht 5' 9.5" (1.765 m)   Wt 266 lb 12.8 oz (121 kg)   LMP 07/30/2015   SpO2 98%   BMI 38.83 kg/m      Physical Exam Psychiatric:        Attention and Perception: Attention normal.        Mood and Affect: Affect is angry.     No results found for any visits on 12/22/23.    Assessment & Plan:    Problem List Items Addressed This Visit  Essential hypertension - Primary   Kimberly Snow states she has not had olmesartan and amlodipine prescription x 2months. States she was not able to maintain previous appointment due to mother's poor health and hospitalization in [place]. She states she is does not like being told she will be dismissed since she did not maintain previous appts. She states does not need me to send any medication refills. She states she will be establishing her care with another provider and will get refills from that provider. I expressed my condolences on her mother's demise. I tried to review previous med refills with Kimberly Snow, but she  interrupted me stating "it does not matter, she is taking her care somewhere else. She does not want anything from a provider who lets go without her medications". She then walked out of the exam room.  Upon review of her medication list: olmesartan and amlodipine prescriptions were sent 02/12/2023, #90 with 3refills. I called [facility] to verify refills. I was informed by the pharmacy assistant that she has pending refills on both medications (#90tabs each).      No follow-ups on file.     Alysia Penna, NP

## 2024-03-12 ENCOUNTER — Other Ambulatory Visit: Payer: Self-pay | Admitting: Nurse Practitioner
